# Patient Record
Sex: Female | Born: 1945 | Race: White | Hispanic: No | Smoking: Never smoker
Health system: Southern US, Community
[De-identification: ages and names within clinical notes are randomized; demographics above are authoritative.]

## PROBLEM LIST (undated history)

## (undated) DIAGNOSIS — I219 Acute myocardial infarction, unspecified: Secondary | ICD-10-CM

## (undated) DIAGNOSIS — C801 Malignant (primary) neoplasm, unspecified: Secondary | ICD-10-CM

## (undated) DIAGNOSIS — E119 Type 2 diabetes mellitus without complications: Secondary | ICD-10-CM

## (undated) DIAGNOSIS — I4891 Unspecified atrial fibrillation: Secondary | ICD-10-CM

## (undated) DIAGNOSIS — I10 Essential (primary) hypertension: Secondary | ICD-10-CM

## (undated) DIAGNOSIS — E079 Disorder of thyroid, unspecified: Secondary | ICD-10-CM

## (undated) HISTORY — PX: BACK SURGERY: SHX140

## (undated) HISTORY — PX: CORONARY ANGIOPLASTY WITH STENT PLACEMENT: SHX49

## (undated) HISTORY — PX: LIVER TRANSPLANT: SHX410

## (undated) HISTORY — PX: ABDOMINAL HYSTERECTOMY: SHX81

## (undated) HISTORY — PX: HERNIA REPAIR: SHX51

## (undated) HISTORY — PX: ABLATION: SHX5711

---

## 2001-04-23 HISTORY — PX: REDUCTION MAMMAPLASTY: SUR839

## 2004-10-10 ENCOUNTER — Encounter: Payer: Self-pay | Admitting: Internal Medicine

## 2004-10-21 ENCOUNTER — Encounter: Payer: Self-pay | Admitting: Internal Medicine

## 2005-02-08 ENCOUNTER — Ambulatory Visit: Payer: Self-pay | Admitting: Internal Medicine

## 2006-02-11 ENCOUNTER — Ambulatory Visit: Payer: Self-pay | Admitting: Internal Medicine

## 2007-07-10 ENCOUNTER — Ambulatory Visit: Payer: Self-pay | Admitting: Family Medicine

## 2008-07-13 ENCOUNTER — Ambulatory Visit: Payer: Self-pay | Admitting: Family Medicine

## 2009-08-04 ENCOUNTER — Ambulatory Visit: Payer: Self-pay | Admitting: Family Medicine

## 2010-08-29 ENCOUNTER — Ambulatory Visit: Payer: Self-pay | Admitting: Family Medicine

## 2010-12-28 DIAGNOSIS — F32A Depression, unspecified: Secondary | ICD-10-CM | POA: Insufficient documentation

## 2010-12-28 DIAGNOSIS — I2510 Atherosclerotic heart disease of native coronary artery without angina pectoris: Secondary | ICD-10-CM | POA: Insufficient documentation

## 2010-12-28 DIAGNOSIS — E781 Pure hyperglyceridemia: Secondary | ICD-10-CM | POA: Insufficient documentation

## 2010-12-28 DIAGNOSIS — E039 Hypothyroidism, unspecified: Secondary | ICD-10-CM | POA: Insufficient documentation

## 2010-12-28 DIAGNOSIS — I10 Essential (primary) hypertension: Secondary | ICD-10-CM | POA: Insufficient documentation

## 2011-09-05 ENCOUNTER — Ambulatory Visit: Payer: Self-pay | Admitting: Family Medicine

## 2012-03-05 ENCOUNTER — Ambulatory Visit: Payer: Self-pay | Admitting: Ophthalmology

## 2012-09-03 DIAGNOSIS — M48061 Spinal stenosis, lumbar region without neurogenic claudication: Secondary | ICD-10-CM | POA: Insufficient documentation

## 2012-09-05 ENCOUNTER — Ambulatory Visit: Payer: Self-pay | Admitting: Obstetrics & Gynecology

## 2013-09-08 ENCOUNTER — Ambulatory Visit: Payer: Self-pay | Admitting: Family Medicine

## 2014-09-08 ENCOUNTER — Other Ambulatory Visit: Payer: Self-pay | Admitting: Family Medicine

## 2014-09-08 DIAGNOSIS — Z1239 Encounter for other screening for malignant neoplasm of breast: Secondary | ICD-10-CM

## 2014-09-14 ENCOUNTER — Ambulatory Visit: Admission: RE | Admit: 2014-09-14 | Payer: Self-pay | Source: Ambulatory Visit

## 2014-09-15 ENCOUNTER — Ambulatory Visit
Admission: RE | Admit: 2014-09-15 | Discharge: 2014-09-15 | Disposition: A | Discharge status: Home / Self Care | Payer: Medicare Other | Source: Ambulatory Visit | Attending: Family Medicine | Admitting: Family Medicine

## 2014-09-15 DIAGNOSIS — Z1231 Encounter for screening mammogram for malignant neoplasm of breast: Secondary | ICD-10-CM | POA: Diagnosis present

## 2014-09-15 DIAGNOSIS — Z1239 Encounter for other screening for malignant neoplasm of breast: Secondary | ICD-10-CM

## 2014-09-15 HISTORY — DX: Malignant (primary) neoplasm, unspecified: C80.1

## 2015-08-29 ENCOUNTER — Other Ambulatory Visit: Payer: Self-pay | Admitting: Family Medicine

## 2015-08-29 DIAGNOSIS — Z1231 Encounter for screening mammogram for malignant neoplasm of breast: Secondary | ICD-10-CM

## 2015-09-20 ENCOUNTER — Ambulatory Visit: Admission: RE | Admit: 2015-09-20 | Payer: Medicare Other | Source: Ambulatory Visit

## 2015-09-27 ENCOUNTER — Ambulatory Visit
Admission: RE | Admit: 2015-09-27 | Discharge: 2015-09-27 | Disposition: A | Discharge status: Home / Self Care | Payer: Medicare Other | Source: Ambulatory Visit | Attending: Family Medicine | Admitting: Family Medicine

## 2015-09-27 DIAGNOSIS — Z1231 Encounter for screening mammogram for malignant neoplasm of breast: Secondary | ICD-10-CM | POA: Diagnosis present

## 2015-10-19 DIAGNOSIS — G4733 Obstructive sleep apnea (adult) (pediatric): Secondary | ICD-10-CM | POA: Insufficient documentation

## 2016-01-04 DIAGNOSIS — G8929 Other chronic pain: Secondary | ICD-10-CM | POA: Insufficient documentation

## 2016-01-04 DIAGNOSIS — M5442 Lumbago with sciatica, left side: Secondary | ICD-10-CM | POA: Insufficient documentation

## 2016-03-04 DIAGNOSIS — Z9889 Other specified postprocedural states: Secondary | ICD-10-CM | POA: Insufficient documentation

## 2016-03-04 DIAGNOSIS — Z7901 Long term (current) use of anticoagulants: Secondary | ICD-10-CM | POA: Insufficient documentation

## 2016-09-13 DIAGNOSIS — I480 Paroxysmal atrial fibrillation: Secondary | ICD-10-CM | POA: Insufficient documentation

## 2017-05-20 DIAGNOSIS — D849 Immunodeficiency, unspecified: Secondary | ICD-10-CM | POA: Insufficient documentation

## 2017-07-30 DIAGNOSIS — I252 Old myocardial infarction: Secondary | ICD-10-CM | POA: Insufficient documentation

## 2017-08-15 ENCOUNTER — Other Ambulatory Visit: Payer: Self-pay | Admitting: Family Medicine

## 2017-08-15 DIAGNOSIS — Z1231 Encounter for screening mammogram for malignant neoplasm of breast: Secondary | ICD-10-CM

## 2017-08-21 ENCOUNTER — Encounter (INDEPENDENT_AMBULATORY_CARE_PROVIDER_SITE_OTHER): Payer: Self-pay

## 2017-08-21 ENCOUNTER — Ambulatory Visit
Admission: RE | Admit: 2017-08-21 | Discharge: 2017-08-21 | Disposition: A | Discharge status: Home / Self Care | Payer: Medicare Other | Source: Ambulatory Visit | Attending: Family Medicine | Admitting: Family Medicine

## 2017-08-21 DIAGNOSIS — Z1231 Encounter for screening mammogram for malignant neoplasm of breast: Secondary | ICD-10-CM | POA: Diagnosis present

## 2018-02-06 ENCOUNTER — Other Ambulatory Visit: Payer: Self-pay | Admitting: Family Medicine

## 2018-02-06 ENCOUNTER — Ambulatory Visit
Admission: RE | Admit: 2018-02-06 | Discharge: 2018-02-06 | Disposition: A | Discharge status: Home / Self Care | Payer: Medicare Other | Source: Ambulatory Visit | Attending: Family Medicine | Admitting: Family Medicine

## 2018-02-06 DIAGNOSIS — R05 Cough: Secondary | ICD-10-CM

## 2018-02-06 DIAGNOSIS — J984 Other disorders of lung: Secondary | ICD-10-CM | POA: Insufficient documentation

## 2018-02-06 DIAGNOSIS — R052 Subacute cough: Secondary | ICD-10-CM

## 2018-05-15 ENCOUNTER — Encounter: Payer: Self-pay | Admitting: Emergency Medicine

## 2018-05-15 ENCOUNTER — Other Ambulatory Visit: Payer: Self-pay

## 2018-05-15 ENCOUNTER — Ambulatory Visit
Admission: EM | Admit: 2018-05-15 | Discharge: 2018-05-15 | Disposition: A | Discharge status: Home / Self Care | Payer: Medicare Other | Attending: Family Medicine | Admitting: Family Medicine

## 2018-05-15 DIAGNOSIS — L7622 Postprocedural hemorrhage and hematoma of skin and subcutaneous tissue following other procedure: Secondary | ICD-10-CM

## 2018-05-15 DIAGNOSIS — Z7901 Long term (current) use of anticoagulants: Secondary | ICD-10-CM | POA: Diagnosis not present

## 2018-05-15 DIAGNOSIS — T148XXA Other injury of unspecified body region, initial encounter: Secondary | ICD-10-CM | POA: Insufficient documentation

## 2018-05-15 HISTORY — DX: Disorder of thyroid, unspecified: E07.9

## 2018-05-15 HISTORY — DX: Type 2 diabetes mellitus without complications: E11.9

## 2018-05-15 HISTORY — DX: Essential (primary) hypertension: I10

## 2018-05-15 HISTORY — DX: Acute myocardial infarction, unspecified: I21.9

## 2018-05-15 MED ORDER — MUPIROCIN 2 % EX OINT: 1.0000 "application " | TOPICAL_OINTMENT | Freq: Two times a day (BID) | CUTANEOUS | 0 refills | Status: AC

## 2018-05-15 NOTE — Discharge Instructions (Addendum)
Leave dressing on until tomorrow.  Topical antibiotic as prescribed.  Take care  Dr. Lacinda Axon

## 2018-05-15 NOTE — ED Provider Notes (Signed)
[facility]    CSN: [id number] Arrival date & time: 05/15/18  1851  History   Chief Complaint Chief Complaint  Patient presents with  . Coagulation Disorder   HPI   73 year old female presents with a bleeding wound.  Patient reports that she saw her dermatologist today.  She had a wart removed on her left anterior thigh.  She states she later noted blood dripping down her leg.  Wound was oozing and the bleeding has not stopped.  Patient thus came in for evaluation.  Patient is on anticoagulation secondary to her underlying medical problems.  She is otherwise feeling well.  Mild bleeding currently.  She has tried to compress the area and use ice but has had no improvement.  No other reported symptoms.  No other complaints.  History reviewed as below. PMH: Depression    Hypothyroidism    Coronary artery disease    Hypertension    Cirrhosis (CMS-HCC)    Pericardial effusion    Paroxysmal atrial flutter (CMS-HCC)    Non-alcoholic fatty liver disease    Liver transplanted (CMS-HCC)    Atrial fibrillation (CMS-HCC)    HLD (hyperlipidemia)    Sleep apnea  cpap  Obesity (BMI 30-39.9)    Spinal stenosis of lumbar region    H/O: GI bleed    Encounter for blood transfusion    Cancer (CMS-HCC)  skin   Chronic kidney disease    Fasting hyperglycemia 02/09/2016   Arthritis    PAF (paroxysmal atrial fibrillation) (CMS-HCC) 07/13/2015   Anticoagulated 03/04/2016   Status post catheter ablation of atrial fibrillation 03/04/2016   History of amiodarone therapy 03/04/2016   CKD (chronic kidney disease) stage 3, GFR 30-59 ml/min (CMS-HCC) 08/13/2016   Diabetes mellitus type 2, uncomplicated (CMS-HCC)    Need for prophylactic immunotherapy 07/09/2012   Myocardial infarction (CMS-HCC)     Surgical Hx: HYSTERECTOMY   vaginal   APPENDECTOMY      ENDOSCOPIC CARPAL TUNNEL RELEASE  Right    HEMMORRHOIDECTOMY      LASIX EYE  SURGERY  Bilateral    CORONARY ANGIOPLASTY WITH STENT PLACEMENT 04/24/1999 - 04/22/2000  1 stent LAD   REDUCTION MAMMAPLASTY      LIVER TRANSPLANTATION 04/24/2007 - 04/22/2008     PERICARDIAL WINDOW      Lumbar Steriod Epidural Injection 09/12/2012     SKIN BIOPSY      COLONOSCOPY W/REMOVAL LESIONS BY SNARE 10/13/2013  Procedure: COLONOSCOPY, FLEXIBLE; WITH REMOVAL OF TUMOR(S), POLYP(S), OR OTHER LESION(S) BY SNARE TECHNIQUE; Surgeon: Lizabeth Leyden, MD; Location: [facility]; Service: Gastroenterology;;   Lumbar ESI 04/24/2011 - 04/22/2012     ABLATION ARRYTHMIA FOCUS      ABDOMINAL SURGERY      EYE SURGERY 1999 & 2015  lasix/ cataract   CHOLECYSTECTOMY 04/24/2007 - 04/22/2008  at time of transplant   [place] 09/03/2016 Chest/Left Procedure: Left THORACOSCOPIC CREATION OF PERICARDIAL WINDOW OR PARTIAL RESECTION OF PERICARDIAL [place]; Surgeon: Guadalupe Dawn, MD; Location: [facility]; Service: Cardiothoracic; Laterality: Left;   COLONOSCOPY W/BIOPSY 06/27/2017 N/A Procedure: Colonoscopy; Surgeon: Erenest Rasher, MD; Location: [facility]; Service: Gastroenterology; Laterality: N/A;   COLONOSCOPY W/REMOVAL LESIONS BY SNARE 06/27/2017 N/A Procedure: COLONOSCOPY, FLEXIBLE; WITH REMOVAL OF TUMOR(S), POLYP(S), OR OTHER LESION(S) BY SNARE TECHNIQUE; Surgeon: Erenest Rasher, MD; Location: [facility]; Service: Gastroenterology; Laterality: N/A;      OB History   No obstetric history on file.    Home Medications  Prior to Admission medications   Medication Sig Start Date End Date Taking? Authorizing Provider  atorvastatin (LIPITOR) 80 MG tablet Take by mouth. 08/19/17  Yes [provider]  Blood Glucose Monitoring Suppl ([place]) [place] 1 each by XX route as directed 02/18/18  Yes [provider]  candesartan (ATACAND) 16 MG tablet Take  by mouth. 10/22/17 10/22/18 Yes [provider]  clopidogrel (PLAVIX) 75 MG tablet Take by mouth. 08/01/17  Yes [provider]  dabigatran (PRADAXA) 150 MG CAPS capsule Take by mouth. 04/07/18  Yes [provider]  glipiZIDE (GLUCOTROL XL) 2.5 MG 24 hr tablet Take by mouth. 01/14/18 01/14/19 Yes [provider]  ipratropium (ATROVENT) 0.03 % nasal spray Place into the nose. 12/26/17 12/26/18 Yes [provider]  levothyroxine (SYNTHROID, LEVOTHROID) 137 MCG tablet Take by mouth. 04/24/18  Yes [provider]  magnesium oxide (MAG-OX) 400 MG tablet Take by mouth. 09/14/09  Yes [provider]  metoprolol succinate (TOPROL-XL) 100 MG 24 hr tablet Take by mouth. 03/29/17  Yes [provider]  nitroGLYCERIN (NITROSTAT) 0.4 MG SL tablet Place under the tongue. 07/31/17  Yes [provider]  pantoprazole (PROTONIX) 40 MG tablet Take by mouth. 02/12/18  Yes [provider]  tacrolimus (PROGRAF) 0.5 MG capsule Take 0.5 mg (1 capsule) BID, alternating days with 0.5mg  (1 capsule) am and 1.0 mg (2 capsules) the next day - PO 09/26/17  Yes [provider]  venlafaxine XR (EFFEXOR-XR) 75 MG 24 hr capsule Take by mouth. 04/07/18  Yes [provider]  Artificial Tear Ointment (ULTRA FRESH PM) OINT Apply to eye.    [provider]  B Complex Vitamins (VITAMIN B-COMPLEX) TABS Take by mouth.    [provider]  Biotin 1000 MCG tablet Take by mouth.    [provider]  Calcium-Magnesium-Vitamin D (CITRACAL CALCIUM+D) [phone] MG-MG-UNIT TB24 Take by mouth.    [provider]  Cholecalciferol (VITAMIN D3) 25 MCG (1000 UT) CAPS Take by mouth.    [provider]  hydrocortisone 2.5 % lotion Apply topically.    [provider]  mupirocin ointment (BACTROBAN) 2 % Apply 1 application topically 2 (two) times daily for 7 days. 05/15/18 05/22/18  Coral Spikes, DO   Social  History Social History   Tobacco Use  . Smoking status: Never Smoker  . Smokeless tobacco: Never Used  Substance Use Topics  . Alcohol use: Not Currently  . Drug use: Never    Allergies   Iodinated diagnostic agents and Lisinopril  Review of Systems Review of Systems  Constitutional: Negative.   Skin: Positive for wound.   Physical Exam Triage Vital Signs ED Triage Vitals  Enc Vitals Group     BP 05/15/18 1942 (!) 163/89     Pulse Rate 05/15/18 1942 62     Resp 05/15/18 1942 18     Temp 05/15/18 1942 98.6 F (37 C)     Temp Source 05/15/18 1942 Oral     SpO2 05/15/18 1942 97 %     Weight 05/15/18 1937 206 lb (93.4 kg)     Height 05/15/18 1937 5\' 5"  (1.651 m)     Head Circumference --      Peak Flow --      Pain Score 05/15/18 1937 0     Pain Loc --      Pain Edu? --      Excl. in [place]? --    Updated Vital Signs  BP (!) 163/89 (BP Location: Right Arm)   Pulse 62   Temp 98.6 F (37 C) (Oral)   Resp 18   Ht 5\' 5"  (1.651 m)   Wt 93.4 kg   SpO2 97%   BMI 34.28 kg/m   Visual Acuity Right Eye Distance:   Left Eye Distance:   Bilateral Distance:    Right Eye Near:   Left Eye Near:    Bilateral Near:     Physical Exam Vitals signs and nursing note reviewed.  Constitutional:      General: She is not in acute distress. HENT:     Head: Normocephalic and atraumatic.     Nose: Nose normal.  Eyes:     General: No scleral icterus.    Conjunctiva/sclera: Conjunctivae normal.  Pulmonary:     Effort: Pulmonary effort is normal.     Breath sounds: Normal breath sounds.  Skin:    Comments: Left anterior thigh with a small wound which is currently bleeding.  Patient appears to have had a shave removal of skin lesion.  Neurological:     Mental Status: She is alert.  Psychiatric:        Mood and Affect: Mood normal.        Behavior: Behavior normal.    UC Treatments / Results  Labs (all labs ordered are listed, but only abnormal results are displayed) Labs  Reviewed - No data to display  EKG None  Radiology No results found.  Procedures Procedures (including critical care time) Area was cleaned with alcohol.  Area was anesthetized with 1% lidocaine with epinephrine.  Wound was cauterized and bleeding resolved.  Wound was dressed with antibiotic ointment, nonstick dressing, and gauze as well as [place].  Patient tolerated procedure well without difficulty.  Medications Ordered in UC Medications - No data to display  Initial Impression / Assessment and Plan / UC Course  I have reviewed the triage vital signs and the nursing notes.  Pertinent labs & imaging results that were available during my care of the patient were reviewed by me and considered in my medical decision making (see chart for details).    73 year old female presents with a bleeding wound after a dermatologic procedure today.  Wound was cauterized today.  See above.  Patient will keep on the dressing until tomorrow.  She is then to change his dressing daily.  Advised use of Bactroban as prescribed.  Supportive care.  Final Clinical Impressions(s) / UC Diagnoses   Final diagnoses:  Bleeding from wound     Discharge Instructions     Leave dressing on until tomorrow.  Topical antibiotic as prescribed.  Take care  Dr. Lacinda Axon    ED Prescriptions    Medication Sig Dispense Auth. Provider   mupirocin ointment (BACTROBAN) 2 % Apply 1 application topically 2 (two) times daily for 7 days. 22 g Coral Spikes, DO     Controlled Substance Prescriptions  Controlled Substance Registry consulted? Not Applicable   Coral Spikes, DO 05/15/18 2128

## 2018-05-15 NOTE — ED Triage Notes (Signed)
Patient states she saw her dermatologist today and had a wart frozen off on her upper left leg. She states the area continues to have mild bleeding. She is on a blood thinner.

## 2018-09-22 ENCOUNTER — Other Ambulatory Visit: Payer: Self-pay | Admitting: Family Medicine

## 2018-09-22 DIAGNOSIS — Z1231 Encounter for screening mammogram for malignant neoplasm of breast: Secondary | ICD-10-CM

## 2018-11-03 ENCOUNTER — Other Ambulatory Visit: Payer: Self-pay

## 2018-11-03 ENCOUNTER — Ambulatory Visit
Admission: RE | Admit: 2018-11-03 | Discharge: 2018-11-03 | Disposition: A | Discharge status: Home / Self Care | Payer: Medicare Other | Source: Ambulatory Visit | Attending: Family Medicine | Admitting: Family Medicine

## 2018-11-03 ENCOUNTER — Encounter (INDEPENDENT_AMBULATORY_CARE_PROVIDER_SITE_OTHER): Payer: Self-pay

## 2018-11-03 DIAGNOSIS — Z1231 Encounter for screening mammogram for malignant neoplasm of breast: Secondary | ICD-10-CM

## 2018-11-30 ENCOUNTER — Emergency Department: Payer: Medicare Other

## 2018-11-30 ENCOUNTER — Ambulatory Visit
Admission: EM | Admit: 2018-11-30 | Discharge: 2018-11-30 | Discharge status: Against Medical Advice | Payer: Medicare Other | Source: Home / Self Care

## 2018-11-30 ENCOUNTER — Encounter: Payer: Self-pay | Admitting: Intensive Care

## 2018-11-30 ENCOUNTER — Other Ambulatory Visit: Payer: Self-pay

## 2018-11-30 ENCOUNTER — Emergency Department
Admission: EM | Admit: 2018-11-30 | Discharge: 2018-11-30 | Disposition: A | Discharge status: Home / Self Care | Payer: Medicare Other | Attending: Emergency Medicine | Admitting: Emergency Medicine

## 2018-11-30 DIAGNOSIS — Z955 Presence of coronary angioplasty implant and graft: Secondary | ICD-10-CM | POA: Insufficient documentation

## 2018-11-30 DIAGNOSIS — Z85828 Personal history of other malignant neoplasm of skin: Secondary | ICD-10-CM | POA: Insufficient documentation

## 2018-11-30 DIAGNOSIS — I252 Old myocardial infarction: Secondary | ICD-10-CM | POA: Insufficient documentation

## 2018-11-30 DIAGNOSIS — I10 Essential (primary) hypertension: Secondary | ICD-10-CM | POA: Insufficient documentation

## 2018-11-30 DIAGNOSIS — K5900 Constipation, unspecified: Secondary | ICD-10-CM | POA: Insufficient documentation

## 2018-11-30 DIAGNOSIS — Z944 Liver transplant status: Secondary | ICD-10-CM | POA: Insufficient documentation

## 2018-11-30 DIAGNOSIS — E119 Type 2 diabetes mellitus without complications: Secondary | ICD-10-CM | POA: Insufficient documentation

## 2018-11-30 HISTORY — DX: Unspecified atrial fibrillation: I48.91

## 2018-11-30 MED ORDER — POLYETHYLENE GLYCOL 3350 17 GM/SCOOP PO POWD: 17.0000 g | Freq: Every day | ORAL | 0 refills | Status: AC | PRN

## 2018-11-30 NOTE — ED Notes (Signed)
Sent rainbow to lab. 

## 2018-11-30 NOTE — ED Notes (Signed)
Pt and son pending discharge. EDP still needs to talk to the pt. Pt and son are informed about the delay

## 2018-11-30 NOTE — ED Notes (Signed)
Pt cleaned of stool and wheeled out to car to leave with son.

## 2018-11-30 NOTE — ED Notes (Signed)
Pt went to restroom and had a very large bowel movement. The pt stated after having a bowel movement that she felt better and felt she passed what was blocked. We changed and cleaned pt up,and is waiting in sub wait area.

## 2018-11-30 NOTE — Discharge Instructions (Signed)
Please continue to use Colace twice daily while taking oxycodone, you may also use MiraLAX once daily while taking oxycodone.  Once you have stopped taking oxycodone please continue to take Colace once daily unless you develop diarrhea you may then back off.  Please follow-up with your doctor.

## 2018-11-30 NOTE — ED Notes (Signed)
First Nurse Note: Pt to ED via POV, pt dislocated both shoulders last week, seen at [facility], has been taking pain medication since then. Reports that she has not had a BM in 6 days.

## 2018-11-30 NOTE — ED Notes (Signed)
Label printer down. Called lab-spoke to [place] about putting chart labels on.

## 2018-11-30 NOTE — ED Provider Notes (Signed)
[facility] Emergency Department Provider Note  Time seen: 4:10 PM  I have reviewed the triage vital signs and the nursing notes.   HISTORY  Chief Complaint Fecal Impaction   HPI Sara Larson is a 73 y.o. female with a past medical history of atrial fibrillation, diabetes, hypertension, presents to the emergency department for constipation.  According to the patient she had a fall approximately 1 week ago injuring her shoulder, was placed on oxycodone.  States since that time she is not had a bowel movement.  Yesterday had a very watery bowel movement but felt impacted.  She came to the ER today due to discomfort however while waiting to be seen the patient had a very large bowel movement per patient.  States she is feeling much better.  Patient has been taking Colace at home with the oxycodone.  Denies any recent fever cough congestion or shortness of breath.   Past Medical History:  Diagnosis Date  . Atrial fibrillation ([place])   . Cancer ([place])    skin ca  . Cancer ([place])    liver ca  . Diabetes mellitus without complication ([place])   . Hypertension   . Myocardial infarct ([place])   . Thyroid disease     There are no active problems to display for this patient.   Past Surgical History:  Procedure Laterality Date  . ABDOMINAL HYSTERECTOMY    . ABLATION    . CORONARY ANGIOPLASTY WITH STENT PLACEMENT    . LIVER TRANSPLANT    . REDUCTION MAMMAPLASTY  2003    Prior to Admission medications   Medication Sig Start Date End Date Taking? Authorizing Provider  Artificial Tear Ointment (ULTRA FRESH PM) OINT Apply to eye.    [provider]  atorvastatin (LIPITOR) 80 MG tablet Take by mouth. 08/19/17   [provider]  B Complex Vitamins (VITAMIN B-COMPLEX) TABS Take by mouth.    [provider]  Biotin 1000 MCG tablet Take by mouth.    [provider]  Blood Glucose Monitoring Suppl ([place]) [place] 1 each by  XX route as directed 02/18/18   [provider]  Calcium-Magnesium-Vitamin D (CITRACAL CALCIUM+D) [phone] MG-MG-UNIT TB24 Take by mouth.    [provider]  candesartan (ATACAND) 16 MG tablet Take by mouth. 10/22/17 10/22/18  [provider]  Cholecalciferol (VITAMIN D3) 25 MCG (1000 UT) CAPS Take by mouth.    [provider]  clopidogrel (PLAVIX) 75 MG tablet Take by mouth. 08/01/17   [provider]  dabigatran (PRADAXA) 150 MG CAPS capsule Take by mouth. 04/07/18   [provider]  glipiZIDE (GLUCOTROL XL) 2.5 MG 24 hr tablet Take by mouth. 01/14/18 01/14/19  [provider]  hydrocortisone 2.5 % lotion Apply topically.    [provider]  ipratropium (ATROVENT) 0.03 % nasal spray Place into the nose. 12/26/17 12/26/18  [provider]  levothyroxine (SYNTHROID, LEVOTHROID) 137 MCG tablet Take by mouth. 04/24/18   [provider]  magnesium oxide (MAG-OX) 400 MG tablet Take by mouth. 09/14/09   [provider]  metoprolol succinate (TOPROL-XL) 100 MG 24 hr tablet Take by mouth. 03/29/17   [provider]  nitroGLYCERIN (NITROSTAT) 0.4 MG SL tablet Place under the tongue. 07/31/17   [provider]  pantoprazole (PROTONIX) 40 MG tablet Take by mouth. 02/12/18   [provider]  tacrolimus (PROGRAF) 0.5 MG capsule Take 0.5 mg (1 capsule) BID, alternating days with 0.5mg  (1  capsule) am and 1.0 mg (2 capsules) the next day - PO 09/26/17   [provider]  venlafaxine XR (EFFEXOR-XR) 75 MG 24 hr capsule Take by mouth. 04/07/18   [provider]    Allergies  Allergen Reactions  . Iodinated Diagnostic Agents Other (See Comments)    Rash/red face  . Lisinopril Cough    Family History  Problem Relation Age of Onset  . Breast cancer Paternal Grandmother 23    Social History Social History   Tobacco Use  . Smoking status: Never Smoker  . Smokeless tobacco: Never  Used  Substance Use Topics  . Alcohol use: Not Currently  . Drug use: Never    Review of Systems Constitutional: Negative for fever. ENT: Negative for recent illness/congestion Cardiovascular: Negative for chest pain. Respiratory: Negative for shortness of breath. Gastrointestinal: Constipation, now resolved. Musculoskeletal: Negative for musculoskeletal complaints Skin: Negative for skin complaints  Neurological: Negative for headache All other ROS negative  ____________________________________________   PHYSICAL EXAM:  VITAL SIGNS: ED Triage Vitals  Enc Vitals Group     BP 11/30/18 1413 (!) 141/72     Pulse Rate 11/30/18 1413 67     Resp 11/30/18 1550 18     Temp 11/30/18 1413 99.1 F (37.3 C)     Temp Source 11/30/18 1413 Oral     SpO2 11/30/18 1413 98 %     Weight 11/30/18 1414 210 lb (95.3 kg)     Height 11/30/18 1414 5\' 5"  (1.651 m)     Head Circumference --      Peak Flow --      Pain Score 11/30/18 1430 10     Pain Loc --      Pain Edu? --      Excl. in [place]? --    Constitutional: Alert and oriented. Well appearing and in no distress. Eyes: Normal exam ENT      Head: Normocephalic and atraumatic.      Mouth/Throat: Mucous membranes are moist. Cardiovascular: Normal rate, regular rhythm. Respiratory: Normal respiratory effort without tachypnea nor retractions. Breath sounds are clear  Gastrointestinal: Soft and nontender. No distention.   Musculoskeletal: Bilateral shoulder pain Neurologic:  Normal speech and language. No gross focal neurologic deficits  Skin:  Skin is warm, dry and intact.  Psychiatric: Mood and affect are normal.  ___________________________________________    RADIOLOGY  IMPRESSION:  1. Nonobstructive bowel gas pattern.  2. Moderate amount of formed stool in the right colon.   ____________________________________________   INITIAL IMPRESSION / ASSESSMENT AND PLAN / ED COURSE  Pertinent labs & imaging results that were  available during my care of the patient were reviewed by me and considered in my medical decision making (see chart for details).   Patient presents to the emergency department for constipation.  Differential would include constipation due to opioid use, fecal impaction, intra-abdominal pathology.  Patient had a large bowel movement in the emergency department now feels considerably better.  We will obtain a 2 view x-ray to evaluate for stool burden.  We will likely discharge home with MiraLAX and continued Colace.  Patient agreeable to plan of care.  Patient's x-ray shows a nonobstructive bowel gas pattern with moderate mount of formed stool in the right colon.  We will discharge with MiraLAX in addition of the patient's Colace.  Patient will follow-up with her doctor.  Sara Larson was evaluated in Emergency Department on 11/30/2018 for the symptoms described in the history of present illness. She  was evaluated in the context of the global COVID-19 pandemic, which necessitated consideration that the patient might be at risk for infection with the SARS-CoV-2 virus that causes COVID-19. Institutional protocols and algorithms that pertain to the evaluation of patients at risk for COVID-19 are in a state of rapid change based on information released by regulatory bodies including the CDC and federal and state organizations. These policies and algorithms were followed during the patient's care in the ED.  ____________________________________________   FINAL CLINICAL IMPRESSION(S) / ED DIAGNOSES  Constipation   Harvest Dark, MD 11/30/18 1718

## 2018-11-30 NOTE — ED Notes (Signed)
Checked pt's vitals after bowel movement and reported to [employer]. Pt in laying back in sub wait and pt states she is feeling good. Gave pt call bell and blanket.

## 2018-11-30 NOTE — ED Triage Notes (Signed)
Patient presents with constipation since 11/23/18. Patient has been taking oxy for fall.

## 2018-11-30 NOTE — ED Notes (Signed)
Pt given water, ok by MD

## 2018-11-30 NOTE — ED Notes (Signed)
Pt c/o constipation and she states that she hasn't had a bowel movement since 11/23/2018. While waiting for a room pt had a very large BM and states she better but "tired". On assessment, abd is soft and not distended. No NVD.

## 2018-11-30 NOTE — ED Notes (Addendum)
Pt had a small BM while she was being transported from imaging. Brief changed and pt cleaned

## 2018-12-22 DIAGNOSIS — S43429A Sprain of unspecified rotator cuff capsule, initial encounter: Secondary | ICD-10-CM | POA: Insufficient documentation

## 2018-12-22 DIAGNOSIS — M75101 Unspecified rotator cuff tear or rupture of right shoulder, not specified as traumatic: Secondary | ICD-10-CM | POA: Insufficient documentation

## 2018-12-22 DIAGNOSIS — M12811 Other specific arthropathies, not elsewhere classified, right shoulder: Secondary | ICD-10-CM | POA: Insufficient documentation

## 2018-12-31 DIAGNOSIS — E785 Hyperlipidemia, unspecified: Secondary | ICD-10-CM | POA: Insufficient documentation

## 2018-12-31 DIAGNOSIS — G4700 Insomnia, unspecified: Secondary | ICD-10-CM | POA: Insufficient documentation

## 2018-12-31 DIAGNOSIS — D649 Anemia, unspecified: Secondary | ICD-10-CM | POA: Insufficient documentation

## 2018-12-31 DIAGNOSIS — R001 Bradycardia, unspecified: Secondary | ICD-10-CM | POA: Insufficient documentation

## 2018-12-31 DIAGNOSIS — N183 Chronic kidney disease, stage 3 unspecified: Secondary | ICD-10-CM | POA: Insufficient documentation

## 2018-12-31 DIAGNOSIS — N184 Chronic kidney disease, stage 4 (severe): Secondary | ICD-10-CM | POA: Insufficient documentation

## 2019-01-26 DIAGNOSIS — M19019 Primary osteoarthritis, unspecified shoulder: Secondary | ICD-10-CM | POA: Insufficient documentation

## 2019-01-26 DIAGNOSIS — Z96611 Presence of right artificial shoulder joint: Secondary | ICD-10-CM | POA: Insufficient documentation

## 2019-03-31 DIAGNOSIS — M25552 Pain in left hip: Secondary | ICD-10-CM | POA: Insufficient documentation

## 2019-06-09 DIAGNOSIS — M7062 Trochanteric bursitis, left hip: Secondary | ICD-10-CM | POA: Insufficient documentation

## 2019-06-09 DIAGNOSIS — Z944 Liver transplant status: Secondary | ICD-10-CM | POA: Insufficient documentation

## 2019-06-24 DIAGNOSIS — K429 Umbilical hernia without obstruction or gangrene: Secondary | ICD-10-CM | POA: Insufficient documentation

## 2019-07-02 DIAGNOSIS — M76892 Other specified enthesopathies of left lower limb, excluding foot: Secondary | ICD-10-CM | POA: Insufficient documentation

## 2019-07-15 ENCOUNTER — Encounter: Payer: Self-pay | Admitting: Emergency Medicine

## 2019-07-15 ENCOUNTER — Emergency Department: Payer: Medicare Other

## 2019-07-15 ENCOUNTER — Emergency Department
Admission: EM | Admit: 2019-07-15 | Discharge: 2019-07-15 | Disposition: A | Discharge status: Home / Self Care | Payer: Medicare Other | Attending: Emergency Medicine | Admitting: Emergency Medicine

## 2019-07-15 ENCOUNTER — Other Ambulatory Visit: Payer: Self-pay

## 2019-07-15 DIAGNOSIS — E119 Type 2 diabetes mellitus without complications: Secondary | ICD-10-CM | POA: Insufficient documentation

## 2019-07-15 DIAGNOSIS — Z79899 Other long term (current) drug therapy: Secondary | ICD-10-CM | POA: Diagnosis not present

## 2019-07-15 DIAGNOSIS — Z955 Presence of coronary angioplasty implant and graft: Secondary | ICD-10-CM | POA: Diagnosis not present

## 2019-07-15 DIAGNOSIS — Z7984 Long term (current) use of oral hypoglycemic drugs: Secondary | ICD-10-CM | POA: Insufficient documentation

## 2019-07-15 DIAGNOSIS — K5900 Constipation, unspecified: Secondary | ICD-10-CM | POA: Insufficient documentation

## 2019-07-15 DIAGNOSIS — Z7901 Long term (current) use of anticoagulants: Secondary | ICD-10-CM | POA: Insufficient documentation

## 2019-07-15 DIAGNOSIS — Z944 Liver transplant status: Secondary | ICD-10-CM | POA: Insufficient documentation

## 2019-07-15 MED ORDER — MAGNESIUM CITRATE PO SOLN
1.0000 | Freq: Once | ORAL | Status: AC
  Administered 2019-07-15: 1 via ORAL
  Filled 2019-07-15: qty 296

## 2019-07-15 MED ORDER — SORBITOL 70 % SOLN
960.0000 mL | TOPICAL_OIL | Freq: Once | ORAL | Status: AC
  Administered 2019-07-15: 960 mL via RECTAL
  Filled 2019-07-15: qty 240

## 2019-07-15 MED ORDER — DOCUSATE SODIUM 50 MG/5ML PO LIQD
100.0000 mg | Freq: Once | ORAL | Status: AC
  Administered 2019-07-15: 08:00:00 100 mg via ORAL
  Filled 2019-07-15: qty 10

## 2019-07-15 MED ORDER — MINERAL OIL RE ENEM
1.0000 | ENEMA | Freq: Once | RECTAL | Status: AC
  Administered 2019-07-15: 1 via RECTAL

## 2019-07-15 NOTE — ED Notes (Addendum)
First enema unsuccessful at this point. Second enema (soap suds) administered.

## 2019-07-15 NOTE — ED Notes (Signed)
FIRST NURSE NOTE:  PT here with son with complaints of constipation.

## 2019-07-15 NOTE — ED Triage Notes (Signed)
Patient ambulatory to triage with steady gait, without difficulty or distress noted; pt reports no BM since Saturday; denies any pain or nausea; miralax & dulcolax without relief today

## 2019-07-15 NOTE — ED Notes (Signed)
Patient had minimal BM with second enema (soap suds enema). Fleets enema with mineral oil administered.

## 2019-07-15 NOTE — ED Provider Notes (Signed)
[facility] Emergency Department Provider Note  ____________________________________________   First MD Initiated Contact with Patient 07/15/19 [phone]     (approximate)  I have reviewed the triage vital signs and the nursing notes.   HISTORY  Chief Complaint Constipation   HPI Sara Larson is a 74 y.o. female presents to the ED with complaint of constipation.  Patient states that on last Monday she had hernia surgery and was discharged on pain medication.  Patient states that she has not taken any of the pain medication but has not been able to have a BM since Saturday.  Patient states that yesterday she took 2 Dulcolax and 1 capful of MiraLAX without any relief.         Past Medical History:  Diagnosis Date  . Atrial fibrillation ([place])   . Cancer ([place])    skin ca  . Cancer ([place])    liver ca  . Diabetes mellitus without complication ([place])   . Hypertension   . Myocardial infarct ([place])   . Thyroid disease     There are no problems to display for this patient.   Past Surgical History:  Procedure Laterality Date  . ABDOMINAL HYSTERECTOMY    . ABLATION    . CORONARY ANGIOPLASTY WITH STENT PLACEMENT    . HERNIA REPAIR    . LIVER TRANSPLANT    . REDUCTION MAMMAPLASTY  2003    Prior to Admission medications   Medication Sig Start Date End Date Taking? Authorizing Provider  Artificial Tear Ointment (ULTRA FRESH PM) OINT Apply to eye.    [provider]  atorvastatin (LIPITOR) 80 MG tablet Take by mouth. 08/19/17   [provider]  B Complex Vitamins (VITAMIN B-COMPLEX) TABS Take by mouth.    [provider]  Biotin 1000 MCG tablet Take by mouth.    [provider]  Blood Glucose Monitoring Suppl ([place]) [place] 1 each by XX route as directed 02/18/18   [provider]  Calcium-Magnesium-Vitamin D (CITRACAL CALCIUM+D) [id number] MG-MG-UNIT TB24 Take by mouth.    [provider]  candesartan (ATACAND) 16 MG tablet Take by mouth. 10/22/17 10/22/18  [provider]  Cholecalciferol (VITAMIN D3) 25 MCG (1000 UT) CAPS Take by mouth.    [provider]  clopidogrel (PLAVIX) 75 MG tablet Take by mouth. 08/01/17   [provider]  dabigatran (PRADAXA) 150 MG CAPS capsule Take by mouth. 04/07/18   [provider]  glipiZIDE (GLUCOTROL XL) 2.5 MG 24 hr tablet Take by mouth. 01/14/18 01/14/19  [provider]  hydrocortisone 2.5 % lotion Apply topically.    [provider]  ipratropium (ATROVENT) 0.03 % nasal spray Place into the nose. 12/26/17 12/26/18  [provider]  levothyroxine (SYNTHROID, LEVOTHROID) 137 MCG tablet Take by mouth. 04/24/18   [provider]  magnesium oxide (MAG-OX) 400 MG tablet Take by mouth. 09/14/09   [provider]  metoprolol succinate (TOPROL-XL) 100 MG 24 hr tablet Take by mouth. 03/29/17   [provider]  nitroGLYCERIN (NITROSTAT) 0.4 MG SL tablet Place under the tongue. 07/31/17   [provider]  pantoprazole (PROTONIX) 40 MG tablet Take by mouth. 02/12/18   [provider]  polyethylene glycol powder (GLYCOLAX/MIRALAX) 17 GM/SCOOP powder Take 17 g by mouth daily as needed for moderate constipation. 11/30/18   Harvest Dark, MD  tacrolimus (PROGRAF) 0.5 MG capsule Take 0.5 mg (1 capsule) BID, alternating days with 0.5mg  (1 capsule)  am and 1.0 mg (2 capsules) the next day - PO 09/26/17   [provider]  venlafaxine XR (EFFEXOR-XR) 75 MG 24 hr capsule Take by mouth. 04/07/18   [provider]    Allergies Iodinated diagnostic agents and Lisinopril  Family History  Problem Relation Age of Onset  . Breast cancer Paternal Grandmother 53    Social History Social History   Tobacco Use  . Smoking status: Never Smoker  . Smokeless tobacco: Never Used  Substance Use Topics  . Alcohol use: Not Currently  . Drug use:  Never    Review of Systems Constitutional: No fever/chills Eyes: No visual changes. ENT: No sore throat. Cardiovascular: Denies chest pain. Respiratory: Denies shortness of breath.  Negative for cough. Gastrointestinal: No abdominal pain.  No nausea, no vomiting.  No diarrhea.  Positive constipation. Genitourinary: Negative for dysuria. Musculoskeletal: Negative for muscle aches. Skin: Negative for rash. Neurological: Negative for headaches, focal weakness or numbness. ___________________________________________   PHYSICAL EXAM:  VITAL SIGNS: ED Triage Vitals  Enc Vitals Group     BP 07/15/19 0036 133/83     Pulse Rate 07/15/19 0036 (!) 51     Resp 07/15/19 0036 20     Temp 07/15/19 0036 98.7 F (37.1 C)     Temp Source 07/15/19 0036 Oral     SpO2 07/15/19 0036 95 %     Weight 07/15/19 0036 220 lb (99.8 kg)     Height 07/15/19 0036 5\' 5"  (1.651 m)     Head Circumference --      Peak Flow --      Pain Score 07/15/19 0043 0     Pain Loc --      Pain Edu? --      Excl. in [place]? --    Constitutional: Alert and oriented. Well appearing and in no acute distress. Eyes: Conjunctivae are normal.  Head: Atraumatic. Neck: No stridor.   Cardiovascular: Normal rate, regular rhythm. Grossly normal heart sounds.  Good peripheral circulation. Respiratory: Normal respiratory effort.  No retractions. Lungs CTAB. Gastrointestinal: Soft and nontender. No distention.  Bowel sounds are present x4 quadrants. Musculoskeletal: No lower extremity tenderness nor edema.  No joint effusions. Neurologic:  Normal speech and language. No gross focal neurologic deficits are appreciated. No gait instability. Skin:  Skin is warm, dry and intact. No rash noted. Psychiatric: Mood and affect are normal. Speech and behavior are normal.  ____________________________________________   LABS (all labs ordered are listed, but only abnormal results are displayed)  Labs Reviewed - No data to  display  RADIOLOGY   Official radiology report(s): DG Abdomen 1 View  Result Date: 07/15/2019 CLINICAL DATA:  Constipation EXAM: ABDOMEN - 1 VIEW COMPARISON:  None. FINDINGS: There is a moderate to large amount of colonic stool. There is air down to the level of the rectum. No definite evidence of obstruction. No radio-opaque calculi or other significant radiographic abnormality are seen. IMPRESSION: Moderate to large amount of colonic stool. No definite evidence of obstruction. Electronically Signed   By: Prudencio Pair M.D.   On: 07/15/2019 01:17    ____________________________________________   PROCEDURES  Procedure(s) performed (including Critical Care):  Procedures  ___________________________________________   INITIAL IMPRESSION / ASSESSMENT AND PLAN / ED COURSE  As part of my medical decision making, I reviewed the following data within the electronic MEDICAL RECORD NUMBER Notes from prior ED visits and [place] Controlled Substance Database    ----------------------------------------- 1:33 PM on 07/15/2019 ----------------------------------------- Patient is feeling  much better and has had several bowel movements especially after the smog enema.  She denies any abdominal pain.   74 year old female presents to the ED with complaint of constipation for the last 4 days.  Patient states that she had surgery and although she was given pain medication to take at home she has not taken it.  Yesterday she took 2 Dulcolax tablets and 1 capful of MiraLAX without any relief.  X-ray shows moderate stool.  Patient was given enemas plus half a bottle containing Colace 100 mg suspension and mag citrate.  Prior to discharge patient had multiple bowel movements and states that she is feeling much better.  She was instructed to continue with Colace or MiraLAX which she already has at home.  She is instructed if she uses the MiraLAX to make sure she drinks plenty of water with it as it could cause more  constipation.  She is to follow-up with her PCP if any continued problems.  Most likely her constipation was caused from the anesthesia that was used during her surgery.  ____________________________________________   FINAL CLINICAL IMPRESSION(S) / ED DIAGNOSES  Final diagnoses:  Constipation, unspecified constipation type     ED Discharge Orders    None       Note:  This document was prepared using Dragon voice recognition software and may include unintentional dictation errors.    Johnn Hai, PA-C 07/15/19 1351    Earleen Newport, MD 07/15/19 1525

## 2019-07-15 NOTE — Discharge Instructions (Signed)
Follow-up with your primary care provider if any continued problems or concerns.  Read the information about high fiber diets.  Also a you may continue taking Colace stool softeners to prevent getting constipated.  The MiraLAX that she obtained you can continue taking 1/2-1 Full per day with any beverage of your choice once a day.  Follow this with lots of fluids including 1-2 bottles of water with the MiraLAX.

## 2019-09-24 ENCOUNTER — Other Ambulatory Visit: Payer: Self-pay | Admitting: Family Medicine

## 2019-09-24 DIAGNOSIS — Z1231 Encounter for screening mammogram for malignant neoplasm of breast: Secondary | ICD-10-CM

## 2019-10-08 ENCOUNTER — Other Ambulatory Visit: Payer: Self-pay

## 2019-10-08 ENCOUNTER — Emergency Department
Admission: EM | Admit: 2019-10-08 | Discharge: 2019-10-08 | Disposition: A | Discharge status: Home / Self Care | Payer: Medicare Other | Attending: Emergency Medicine | Admitting: Emergency Medicine

## 2019-10-08 ENCOUNTER — Encounter: Payer: Self-pay | Admitting: Emergency Medicine

## 2019-10-08 ENCOUNTER — Emergency Department: Payer: Medicare Other

## 2019-10-08 DIAGNOSIS — Z7901 Long term (current) use of anticoagulants: Secondary | ICD-10-CM | POA: Diagnosis not present

## 2019-10-08 DIAGNOSIS — Y999 Unspecified external cause status: Secondary | ICD-10-CM | POA: Insufficient documentation

## 2019-10-08 DIAGNOSIS — Z7984 Long term (current) use of oral hypoglycemic drugs: Secondary | ICD-10-CM | POA: Diagnosis not present

## 2019-10-08 DIAGNOSIS — W268XXA Contact with other sharp object(s), not elsewhere classified, initial encounter: Secondary | ICD-10-CM | POA: Diagnosis not present

## 2019-10-08 DIAGNOSIS — Y929 Unspecified place or not applicable: Secondary | ICD-10-CM | POA: Diagnosis not present

## 2019-10-08 DIAGNOSIS — S61217A Laceration without foreign body of left little finger without damage to nail, initial encounter: Secondary | ICD-10-CM | POA: Diagnosis not present

## 2019-10-08 DIAGNOSIS — S60222A Contusion of left hand, initial encounter: Secondary | ICD-10-CM | POA: Diagnosis not present

## 2019-10-08 DIAGNOSIS — Z79899 Other long term (current) drug therapy: Secondary | ICD-10-CM | POA: Insufficient documentation

## 2019-10-08 DIAGNOSIS — I4891 Unspecified atrial fibrillation: Secondary | ICD-10-CM | POA: Diagnosis not present

## 2019-10-08 DIAGNOSIS — Y9389 Activity, other specified: Secondary | ICD-10-CM | POA: Insufficient documentation

## 2019-10-08 DIAGNOSIS — E119 Type 2 diabetes mellitus without complications: Secondary | ICD-10-CM | POA: Insufficient documentation

## 2019-10-08 DIAGNOSIS — I10 Essential (primary) hypertension: Secondary | ICD-10-CM | POA: Diagnosis not present

## 2019-10-08 MED ORDER — LIDOCAINE HCL (PF) 1 % IJ SOLN
10.0000 mL | Freq: Once | INTRAMUSCULAR | Status: AC
  Administered 2019-10-08: 10 mL
  Filled 2019-10-08: qty 10

## 2019-10-08 NOTE — ED Notes (Signed)
See triage note  Presents with small laceration noted to left 5 th finger  States she fell caught her finger  This occurred at 330 pm yesterday afternoon

## 2019-10-08 NOTE — Discharge Instructions (Signed)
Follow-up with your primary care provider if any continued problems or concerns.  Your x-rays were negative for any fracture of your hand or fingers.  Keep the area clean and dry.  Leave the dressing that was placed in the ED on for 2 days.  When you take this off you will need to clean it with mild soap and water.  Place a Band-Aid or nonstick dressing to the area.  Suture should be removed in 7 to 10 days.  You may take Tylenol if needed for pain.  Return to the emergency department if any urgent concerns or appearance of infection.

## 2019-10-08 NOTE — ED Triage Notes (Signed)
Pt reports she fell on 6/16 when she was on foot stool and slipped. Pt hit left hand with door. Pt to ED this AM due to laceration to the left 5th finger and bleeding continuing. Pt take blood thinner, Prodaxa.

## 2019-10-08 NOTE — ED Provider Notes (Signed)
[facility] Emergency Department Provider Note   ____________________________________________   First MD Initiated Contact with Patient 10/08/19 [phone]     (approximate)  I have reviewed the triage vital signs and the nursing notes.   HISTORY  Chief Complaint Laceration   HPI Sara Larson is a 74 y.o. female presents to the ED with a laceration x2 to her left fifth finger.  Patient states that she caught her finger on a door.  Patient states that she was on a footstool and slipped.  She denies any head injury or loss of consciousness.  Patient has 2 small lacerations to her left fifth finger along with some pain to her third and fourth finger.  Patient is taking a blood thinner, Pradaxa, and has been unable to get the bleeding under control.  She states that her last tetanus has been within 5 years.       Past Medical History:  Diagnosis Date  . Atrial fibrillation ([place])   . Cancer ([place])    skin ca  . Cancer ([place])    liver ca  . Diabetes mellitus without complication ([place])   . Hypertension   . Myocardial infarct ([place])   . Thyroid disease     There are no problems to display for this patient.   Past Surgical History:  Procedure Laterality Date  . ABDOMINAL HYSTERECTOMY    . ABLATION    . CORONARY ANGIOPLASTY WITH STENT PLACEMENT    . HERNIA REPAIR    . LIVER TRANSPLANT    . REDUCTION MAMMAPLASTY  2003    Prior to Admission medications   Medication Sig Start Date End Date Taking? Authorizing Provider  Artificial Tear Ointment (ULTRA FRESH PM) OINT Apply to eye.    [provider]  atorvastatin (LIPITOR) 80 MG tablet Take by mouth. 08/19/17   [provider]  B Complex Vitamins (VITAMIN B-COMPLEX) TABS Take by mouth.    [provider]  Biotin 1000 MCG tablet Take by mouth.    [provider]  Blood Glucose Monitoring Suppl ([place]) [place] 1 each by XX route as directed 02/18/18    [provider]  Calcium-Magnesium-Vitamin D (CITRACAL CALCIUM+D) [id number] MG-MG-UNIT TB24 Take by mouth.    [provider]  candesartan (ATACAND) 16 MG tablet Take by mouth. 10/22/17 10/22/18  [provider]  Cholecalciferol (VITAMIN D3) 25 MCG (1000 UT) CAPS Take by mouth.    [provider]  clopidogrel (PLAVIX) 75 MG tablet Take by mouth. 08/01/17   [provider]  dabigatran (PRADAXA) 150 MG CAPS capsule Take by mouth. 04/07/18   [provider]  glipiZIDE (GLUCOTROL XL) 2.5 MG 24 hr tablet Take by mouth. 01/14/18 01/14/19  [provider]  hydrocortisone 2.5 % lotion Apply topically.    [provider]  ipratropium (ATROVENT) 0.03 % nasal spray Place into the nose. 12/26/17 12/26/18  [provider]  levothyroxine (SYNTHROID, LEVOTHROID) 137 MCG tablet Take by mouth. 04/24/18   [provider]  magnesium oxide (MAG-OX) 400 MG tablet Take by mouth. 09/14/09   [provider]  metoprolol succinate (TOPROL-XL) 100 MG 24 hr tablet Take by mouth. 03/29/17   [provider]  nitroGLYCERIN (NITROSTAT) 0.4 MG SL tablet Place under the tongue. 07/31/17   [provider]  pantoprazole (PROTONIX) 40 MG tablet Take by mouth. 02/12/18   [provider]  polyethylene glycol powder (GLYCOLAX/MIRALAX) 17 GM/SCOOP powder Take 17 g by mouth daily  as needed for moderate constipation. 11/30/18   Harvest Dark, MD  tacrolimus (PROGRAF) 0.5 MG capsule Take 0.5 mg (1 capsule) BID, alternating days with 0.5mg  (1 capsule) am and 1.0 mg (2 capsules) the next day - PO 09/26/17   [provider]  venlafaxine XR (EFFEXOR-XR) 75 MG 24 hr capsule Take by mouth. 04/07/18   [provider]    Allergies Iodinated diagnostic agents and Lisinopril  Family History  Problem Relation Age of Onset  . Breast cancer Paternal Grandmother 29    Social History Social History   Tobacco Use  .  Smoking status: Never Smoker  . Smokeless tobacco: Never Used  Substance Use Topics  . Alcohol use: Not Currently  . Drug use: Never    Review of Systems Constitutional: No fever/chills Eyes: No visual changes. ENT: No trauma. Cardiovascular: Denies chest pain. Respiratory: Denies shortness of breath. Gastrointestinal: No abdominal pain.  No nausea, no vomiting. Musculoskeletal: Positive left hand pain. Skin: Positive laceration left fifth finger. Neurological: Negative for headaches, focal weakness or numbness. ____________________________________________   PHYSICAL EXAM:  VITAL SIGNS: ED Triage Vitals [10/08/19 0223]  Enc Vitals Group     BP (!) 144/85     Pulse Rate (!) 58     Resp 20     Temp 97.9 F (36.6 C)     Temp Source Oral     SpO2 98 %     Weight      Height      Head Circumference      Peak Flow      Pain Score      Pain Loc      Pain Edu?      Excl. in [place]?     Constitutional: Alert and oriented. Well appearing and in no acute distress. Eyes: Conjunctivae are normal.  Head: Atraumatic. Neck: No stridor.  No cervical tenderness on palpation posteriorly. Cardiovascular: Normal rate, regular rhythm. Grossly normal heart sounds.  Good peripheral circulation. Respiratory: Normal respiratory effort.  No retractions. Lungs CTAB. Gastrointestinal: Soft and nontender. No distention.  Musculoskeletal: Third fourth fifth digits are tender with soft tissue edema.  Patient is able to move digits slowly and flex and extend.  Motor sensory function intact.  Capillary refills less than 3 seconds. Neurologic:  Normal speech and language. No gross focal neurologic deficits are appreciated. No gait instability. Skin:  Skin is warm, dry.  Laceration x2 left fifth digit. Psychiatric: Mood and affect are normal. Speech and behavior are normal.  ____________________________________________   LABS (all labs ordered are listed, but only abnormal results are  displayed)  Labs Reviewed - No data to display  RADIOLOGY   Official radiology report(s): DG Hand Complete Left  Result Date: 10/08/2019 CLINICAL DATA:  Fall, laceration to little finger EXAM: LEFT HAND - COMPLETE 3+ VIEW COMPARISON:  None. FINDINGS: There is no evidence of fracture or dislocation. There is no evidence of arthropathy or other focal bone abnormality. Soft tissues are unremarkable. IMPRESSION: No acute bony abnormality. Electronically Signed   By: Rolm Baptise M.D.   On: 10/08/2019 02:48    ____________________________________________   PROCEDURES  Procedure(s) performed (including Critical Care):  Marland KitchenMarland KitchenLaceration Repair  Date/Time: 10/08/2019 8:52 AM Performed by: Johnn Hai, PA-C Authorized by: Johnn Hai, PA-C   Consent:    Consent obtained:  Verbal   Consent given by:  Patient   Risks discussed:  Poor wound healing, infection and pain Anesthesia (see MAR for exact dosages):  Anesthesia method:  Nerve block   Block location:  Left fifth digit   Block needle gauge:  25 G   Block anesthetic:  Lidocaine 1% w/o epi   Block injection procedure:  Introduced needle, incremental injection and negative aspiration for blood   Block outcome:  Anesthesia achieved Laceration details:    Location:  Finger   Finger location:  L small finger   Length (cm):  1 Repair type:    Repair type:  Simple Pre-procedure details:    Preparation:  Patient was prepped and draped in usual sterile fashion Exploration:    Hemostasis achieved with:  Direct pressure   Contaminated: no   Treatment:    Area cleansed with:  Saline   Amount of cleaning:  Standard   Irrigation solution:  Sterile saline   Irrigation volume:  60 ml   Irrigation method:  Syringe   Visualized foreign bodies/material removed: no   Skin repair:    Repair method:  Sutures   Suture size:  5-0   Suture material:  Nylon   Suture technique:  Simple interrupted   Number of sutures:   2 Approximation:    Approximation:  Close Post-procedure details:    Dressing:  Non-adherent dressing   Patient tolerance of procedure:  Tolerated well, no immediate complications  .Marland KitchenLaceration Repair  Date/Time: 10/08/2019 10:23 AM Performed by: Johnn Hai, PA-C Authorized by: Johnn Hai, PA-C   Consent:    Consent obtained:  Verbal   Consent given by:  Patient   Risks discussed:  Pain, infection and poor wound healing Anesthesia (see MAR for exact dosages):    Anesthesia method:  Nerve block   Block needle gauge:  25 G   Block anesthetic:  Lidocaine 1% w/o epi   Block technique:  Left fifth digit   Block injection procedure:  Incremental injection, introduced needle and negative aspiration for blood   Block outcome:  Anesthesia achieved Laceration details:    Location:  Finger   Finger location:  L small finger   Length (cm):  1 Repair type:    Repair type:  Simple Pre-procedure details:    Preparation:  Patient was prepped and draped in usual sterile fashion and imaging obtained to evaluate for foreign bodies Exploration:    Hemostasis achieved with:  Direct pressure   Contaminated: no   Treatment:    Area cleansed with:  Saline   Amount of cleaning:  Standard   Irrigation solution:  Sterile saline   Irrigation volume:  30 cc   Irrigation method:  Syringe   Visualized foreign bodies/material removed: no   Skin repair:    Repair method:  Sutures   Suture size:  5-0   Suture material:  Nylon   Suture technique:  Simple interrupted   Number of sutures:  2 Approximation:    Approximation:  Close Post-procedure details:    Dressing:  Non-adherent dressing   Patient tolerance of procedure:  Tolerated well, no immediate complications     ____________________________________________   INITIAL IMPRESSION / ASSESSMENT AND PLAN / ED COURSE  As part of my medical decision making, I reviewed the following data within the electronic MEDICAL RECORD NUMBER  Notes from prior ED visits and [place] Controlled Substance Database  74 year old female presents to the ED after she fell at home cutting her left fifth finger and injuring her third fourth and fifth finger in the fall.  Patient states that she was on a stepstool reaching for something when she  lost her balance.  She reached for a door to keep from falling cutting her finger.  Patient denies any head injury or loss of consciousness.  Patient is able move digits without any difficulty but is guarded with the fifth finger secondary to bleeding.  X-rays were negative for fracture.  Patient tolerated laceration repair well.  A hemastat dressing was applied to the digit to help control bleeding.  Patient was given wound care instructions.  She is to follow-up with her PCP, urgent care or emergency department in 7 to 10 days for suture removal.  ____________________________________________   FINAL CLINICAL IMPRESSION(S) / ED DIAGNOSES  Final diagnoses:  Laceration of left little finger without foreign body without damage to nail, initial encounter  Contusion of left hand, initial encounter     ED Discharge Orders    None       Note:  This document was prepared using Dragon voice recognition software and may include unintentional dictation errors.    Johnn Hai, PA-C 10/08/19 1029    Duffy Bruce, MD 10/12/19 [phone]

## 2019-11-04 ENCOUNTER — Other Ambulatory Visit: Payer: Self-pay

## 2019-11-04 ENCOUNTER — Ambulatory Visit
Admission: RE | Admit: 2019-11-04 | Discharge: 2019-11-04 | Disposition: A | Discharge status: Home / Self Care | Payer: Medicare Other | Source: Ambulatory Visit | Attending: Family Medicine | Admitting: Family Medicine

## 2019-11-04 DIAGNOSIS — Z1231 Encounter for screening mammogram for malignant neoplasm of breast: Secondary | ICD-10-CM | POA: Diagnosis not present

## 2019-11-23 DIAGNOSIS — Z01818 Encounter for other preprocedural examination: Secondary | ICD-10-CM | POA: Insufficient documentation

## 2020-01-11 ENCOUNTER — Ambulatory Visit (INDEPENDENT_AMBULATORY_CARE_PROVIDER_SITE_OTHER): Payer: Medicare Other | Admitting: Internal Medicine

## 2020-01-11 DIAGNOSIS — G4733 Obstructive sleep apnea (adult) (pediatric): Secondary | ICD-10-CM | POA: Diagnosis not present

## 2020-01-11 DIAGNOSIS — I4891 Unspecified atrial fibrillation: Secondary | ICD-10-CM

## 2020-01-11 DIAGNOSIS — Z9989 Dependence on other enabling machines and devices: Secondary | ICD-10-CM | POA: Diagnosis not present

## 2020-01-11 DIAGNOSIS — Z7189 Other specified counseling: Secondary | ICD-10-CM

## 2020-01-11 NOTE — Patient Instructions (Signed)

## 2020-01-11 NOTE — Progress Notes (Signed)
[facility] [place], [place] [address]  Pulmonary Sleep Medicine   Office Visit Note  Patient Name: Sara Larson DOB: 1945-10-29 MRN [id number]    Chief Complaint: Obstructive Sleep Apnea visit  Brief History:  Sara Larson is seen today for follow up The patient has a 8 year history of sleep apnea. Patient is using PAP nightly. She has been in and out of Afib quite a bit lately.She reports difficulty initiating sleep. She gets in bed between 11:30 and 12:30 p.m and does not get up until noon or later. She wakes briefly to urinate several times. The patient feels better after sleeping with PAP.  The patient reports benefiting from PAP use. Reported sleepiness is  improved and the Epworth Sleepiness Score is 2 out of 24. The patient does not take naps but will rest during the day. The patient complains of the following: dry mouth  The compliance download shows excellent compliance with an average use time of 10.2 hours. The AHI is 3.9  The patient does not complain of limb movements disrupting sleep.  ROS  General: (-) fever, (-) chills, (-) night sweat Nose and Sinuses: (-) nasal stuffiness or itchiness, (-) postnasal drip, (-) nosebleeds, (-) sinus trouble. Mouth and Throat: (-) sore throat, (-) hoarseness. Neck: (-) swollen glands, (-) enlarged thyroid, (-) neck pain. Respiratory: - cough, - shortness of breath, - wheezing. Neurologic: + numbness, + tingling. Psychiatric: - anxiety, - depression   Current Medication: Outpatient Encounter Medications as of 01/11/2020  Medication Sig  . Artificial Tear Ointment (ULTRA FRESH PM) OINT Apply to eye.  Marland Kitchen atorvastatin (LIPITOR) 80 MG tablet Take by mouth.  . B Complex Vitamins (VITAMIN B-COMPLEX) TABS Take by mouth.  . Biotin 1000 MCG tablet Take by mouth.  . Blood Glucose Monitoring Suppl (GLUCOCOM BLOOD GLUCOSE MONITOR) DEVI 1 each by XX route as directed  . Calcium-Magnesium-Vitamin D (CITRACAL CALCIUM+D)  600-40-500 MG-MG-UNIT TB24 Take by mouth.  . candesartan (ATACAND) 16 MG tablet Take by mouth.  . Cholecalciferol (VITAMIN D3) 25 MCG (1000 UT) CAPS Take by mouth.  . clopidogrel (PLAVIX) 75 MG tablet Take by mouth.  . dabigatran (PRADAXA) 150 MG CAPS capsule Take by mouth.  Marland Kitchen glipiZIDE (GLUCOTROL XL) 2.5 MG 24 hr tablet Take by mouth.  . hydrocortisone 2.5 % lotion Apply topically.  Marland Kitchen ipratropium (ATROVENT) 0.03 % nasal spray Place into the nose.  . levothyroxine (SYNTHROID, LEVOTHROID) 137 MCG tablet Take by mouth.  . magnesium oxide (MAG-OX) 400 MG tablet Take by mouth.  . metoprolol succinate (TOPROL-XL) 100 MG 24 hr tablet Take by mouth.  . nitroGLYCERIN (NITROSTAT) 0.4 MG SL tablet Place under the tongue.  . pantoprazole (PROTONIX) 40 MG tablet Take by mouth.  . polyethylene glycol powder (GLYCOLAX/MIRALAX) 17 GM/SCOOP powder Take 17 g by mouth daily as needed for moderate constipation.  . tacrolimus (PROGRAF) 0.5 MG capsule Take 0.5 mg (1 capsule) BID, alternating days with 0.5mg  (1 capsule) am and 1.0 mg (2 capsules) the next day - PO  . venlafaxine XR (EFFEXOR-XR) 75 MG 24 hr capsule Take by mouth.   No facility-administered encounter medications on file as of 01/11/2020.    Surgical History: Past Surgical History:  Procedure Laterality Date  . ABDOMINAL HYSTERECTOMY    . ABLATION    . CORONARY ANGIOPLASTY WITH STENT PLACEMENT    . HERNIA REPAIR    . LIVER TRANSPLANT    . REDUCTION MAMMAPLASTY  2003    Medical History: Past  Medical History:  Diagnosis Date  . Atrial fibrillation ([place])   . Cancer ([place])    skin ca  . Cancer ([place])    liver ca  . Diabetes mellitus without complication ([place])   . Hypertension   . Myocardial infarct ([place])   . Thyroid disease     Family History: Non contributory to the present illness  Social History: Social History   Socioeconomic History  . Marital status: Widowed    Spouse name: Not on file  . Number of children: Not on file   . Years of education: Not on file  . Highest education level: Not on file  Occupational History  . Not on file  Tobacco Use  . Smoking status: Never Smoker  . Smokeless tobacco: Never Used  Substance and Sexual Activity  . Alcohol use: Not Currently  . Drug use: Never  . Sexual activity: Not on file  Other Topics Concern  . Not on file  Social History Narrative  . Not on file   Social Determinants of Health   Financial Resource Strain:   . Difficulty of Paying Living Expenses: Not on file  Food Insecurity:   . Worried About Charity fundraiser in the Last Year: Not on file  . Ran Out of Food in the Last Year: Not on file  Transportation Needs:   . Lack of Transportation (Medical): Not on file  . Lack of Transportation (Non-Medical): Not on file  Physical Activity:   . Days of Exercise per Week: Not on file  . Minutes of Exercise per Session: Not on file  Stress:   . Feeling of Stress : Not on file  Social Connections:   . Frequency of Communication with Friends and Family: Not on file  . Frequency of Social Gatherings with Friends and Family: Not on file  . Attends Religious Services: Not on file  . Active Member of Clubs or Organizations: Not on file  . Attends Archivist Meetings: Not on file  . Marital Status: Not on file  Intimate Partner Violence:   . Fear of Current or Ex-Partner: Not on file  . Emotionally Abused: Not on file  . Physically Abused: Not on file  . Sexually Abused: Not on file    Vital Signs: Blood pressure 109/61, pulse (!) 50, height 5\' 4"  (1.626 m), weight 202 lb (91.6 kg), SpO2 97 %.  Examination: General Appearance: The patient is well-developed, well-nourished, and in no distress. Neck Circumference: 47 Skin: Gross inspection of skin unremarkable. Head: normocephalic, no gross deformities. Eyes: no gross deformities noted. ENT: ears appear grossly normal Neurologic: Alert and oriented. No involuntary  movements.    EPWORTH SLEEPINESS SCALE:  Scale:  (0)= no chance of dozing; (1)= slight chance of dozing; (2)= moderate chance of dozing; (3)= high chance of dozing  Chance  Situtation    Sitting and reading: 1    Watching TV: 0    Sitting Inactive in public: 0    As a passenger in car: 1      Lying down to rest: 0    Sitting and talking: 0    Sitting quielty after lunch: 0    In a car, stopped in traffic: 0   TOTAL SCORE:   2 out of 24    SLEEP STUDIES:  1. PSG 10/2011 AHI 35 SpO61min 80%   CPAP COMPLIANCE DATA:  Date Range: 01/06/19-01/05/20  Average Daily Use: 10.2 hours  Median Use: 10.4  Compliance for >  4 Hours: 99%  AHI: 3.9 respiratory events per hour  Days Used: 365/365  Mask Leak: 14.3  95th Percentile Pressure: 12/8 cm H2O  LABS: No results found for this or any previous visit (from the past 2160 hour(s)).  Radiology: MM 3D SCREEN BREAST BILATERAL  Result Date: 11/06/2019 CLINICAL DATA:  Screening. EXAM: DIGITAL SCREENING BILATERAL MAMMOGRAM WITH TOMO AND CAD COMPARISON:  Previous exam(s). ACR Breast Density Category a: The breast tissue is almost entirely fatty. FINDINGS: There are no findings suspicious for malignancy. Images were processed with CAD. IMPRESSION: No mammographic evidence of malignancy. A result letter of this screening mammogram will be mailed directly to the patient. RECOMMENDATION: Screening mammogram in one year. (Code:SM-B-01Y) BI-RADS CATEGORY  1: Negative. Electronically Signed   By: Ammie Ferrier M.D.   On: 11/06/2019 10:06    Assessment and Plan: Patient Active Problem List   Diagnosis Date Noted  . OSA on CPAP 01/11/2020  . CPAP use counseling 01/11/2020  . Atrial fibrillation ([place]) 01/11/2020      The patient does tolerate PAP and reports significant benefit from PAP use. The patient is caring for her machine as recommended. She was advised to avoid going to bed when not sleepy and to limit time in bed.  She may benefit from xylimelts for her dry mouth. The patient is trying to get regular exercise. The compliance is excellent. The apnea is well controlled. She has frequent Afib which can cause some elevation of the AHI   1. OSA- continue excellent compliance. Limit time in bed and avoid going to bed if not sleepy. 2. CPAP counseling: Discussed proper use, care and cleaning procedures of her current CPAP machine. 3. A. Fib-Remains stable at this time, continue with routine follow-up with cardiology.  General Counseling: I have discussed the findings of the evaluation and examination with [facility].  I have also discussed any further diagnostic evaluation thatmay be needed or ordered today. Masey verbalizes understanding of the findings of todays visit. We also reviewed her medications today and discussed drug interactions and side effects including but not limited excessive drowsiness and altered mental states. We also discussed that there is always a risk not just to her but also people around her. she has been encouraged to call the office with any questions or concerns that should arise related to todays visit.   I have personally obtained a history, examined the patient, evaluated laboratory and imaging results, formulated the assessment and plan and placed orders.  This patient was seen by Theodoro Grist AGNP-C in Collaboration with Dr.Draysen Weygandt as a part of collaborative care agreement.  Lavera Guise, MD Signed electronically reviewed  Richelle Ito Saunders Glance, PhD, FAASM  Diplomate, American Board of Sleep Medicine    Allyne Gee, MD [facility] Diplomate ABMS Pulmonary and Critical Care Medicine Sleep medicine

## 2020-02-29 DIAGNOSIS — I5032 Chronic diastolic (congestive) heart failure: Secondary | ICD-10-CM | POA: Insufficient documentation

## 2020-04-07 DIAGNOSIS — R899 Unspecified abnormal finding in specimens from other organs, systems and tissues: Secondary | ICD-10-CM | POA: Insufficient documentation

## 2020-07-22 ENCOUNTER — Other Ambulatory Visit: Payer: Self-pay | Admitting: Nurse Practitioner

## 2020-07-22 DIAGNOSIS — N183 Chronic kidney disease, stage 3 unspecified: Secondary | ICD-10-CM | POA: Insufficient documentation

## 2020-07-22 DIAGNOSIS — E1122 Type 2 diabetes mellitus with diabetic chronic kidney disease: Secondary | ICD-10-CM | POA: Insufficient documentation

## 2020-07-22 DIAGNOSIS — K7581 Nonalcoholic steatohepatitis (NASH): Secondary | ICD-10-CM | POA: Insufficient documentation

## 2020-07-22 DIAGNOSIS — Z944 Liver transplant status: Secondary | ICD-10-CM

## 2020-07-25 ENCOUNTER — Telehealth: Payer: Self-pay | Admitting: Adult Health

## 2020-07-25 NOTE — Telephone Encounter (Signed)
I called patient to discuss Evusheld, a long acting monoclonal antibody injection administered to patients with decreased immune systems or intolerance/allergy to the COVID 19 vaccine as COVID19 prevention.    I reached the patient on the phone, and she noted, that she didn't have enough time or money to help me out, then hung up.    I reached out to Beckey Rutter who had placed the order, and said that her son coordinates her care, so I called Colin Mulders and spoke with him.  Brendaly received Evusheld on 07/07/2020, so an injection isn't needed at this time.    Wilber Bihari, NP

## 2020-10-05 ENCOUNTER — Other Ambulatory Visit: Payer: Self-pay | Admitting: Family Medicine

## 2020-10-05 DIAGNOSIS — Z1231 Encounter for screening mammogram for malignant neoplasm of breast: Secondary | ICD-10-CM

## 2020-11-07 ENCOUNTER — Ambulatory Visit
Admission: RE | Admit: 2020-11-07 | Discharge: 2020-11-07 | Disposition: A | Discharge status: Home / Self Care | Payer: Medicare Other | Source: Ambulatory Visit | Attending: Family Medicine | Admitting: Family Medicine

## 2020-11-07 ENCOUNTER — Other Ambulatory Visit: Payer: Self-pay

## 2020-11-07 DIAGNOSIS — Z1231 Encounter for screening mammogram for malignant neoplasm of breast: Secondary | ICD-10-CM | POA: Insufficient documentation

## 2020-12-23 DIAGNOSIS — I4891 Unspecified atrial fibrillation: Secondary | ICD-10-CM | POA: Insufficient documentation

## 2020-12-30 NOTE — Progress Notes (Signed)
[facility] [place], [place] [address]  Pulmonary Sleep Medicine   Office Visit Note  Patient Name: CLARITZA SCHNARS DOB: June 16, 1945 MRN [id number]    Chief Complaint: Obstructive Sleep Apnea visit  Brief History:  Temekia is seen today for one year follow up The patient has a 9 history of sleep apnea. Patient is using PAP nightly @ 12/8cmH2O and was in/out of Afib at last years consult. .  The patient feels better after sleeping with PAP.  The patient reports benefiting from PAP use. Reported sleepiness is  improved and the Epworth Sleepiness Score is 2 out of 24. The patient does not take naps. The patient complains of the following: recurring Afib episodes with slurred. The compliance download shows  compliance with an average use time of 9:27 hours@ 99% compliance. The AHI is 5.0  The patient does not complain of limb movements disrupting sleep.  ROS  General: (-) fever, (-) chills, (-) night sweat Nose and Sinuses: (-) nasal stuffiness or itchiness, (-) postnasal drip, (-) nosebleeds, (-) sinus trouble. Mouth and Throat: (-) sore throat, (-) hoarseness. Neck: (-) swollen glands, (-) enlarged thyroid, (-) neck pain. Respiratory: - cough, - shortness of breath, - wheezing. Neurologic: - numbness, - tingling. Psychiatric: -anxiety, - depression   Current Medication: Outpatient Encounter Medications as of 01/02/2021  Medication Sig   Artificial Tear Ointment (ULTRA FRESH PM) OINT Apply to eye.   atorvastatin (LIPITOR) 80 MG tablet Take by mouth.   B Complex Vitamins (VITAMIN B-COMPLEX) TABS Take by mouth.   Biotin 1000 MCG tablet Take by mouth.   Blood Glucose Monitoring Suppl (GLUCOCOM BLOOD GLUCOSE MONITOR) DEVI 1 each by XX route as directed   Calcium-Magnesium-Vitamin D (CITRACAL CALCIUM+D) 600-40-500 MG-MG-UNIT TB24 Take by mouth.   candesartan (ATACAND) 16 MG tablet Take by mouth.   Cholecalciferol (VITAMIN D3) 25 MCG (1000 UT) CAPS Take by mouth.    clopidogrel (PLAVIX) 75 MG tablet Take by mouth.   dabigatran (PRADAXA) 150 MG CAPS capsule Take by mouth.   glipiZIDE (GLUCOTROL XL) 2.5 MG 24 hr tablet Take by mouth.   hydrocortisone 2.5 % lotion Apply topically.   ipratropium (ATROVENT) 0.03 % nasal spray Place into the nose.   levothyroxine (SYNTHROID, LEVOTHROID) 137 MCG tablet Take by mouth.   magnesium oxide (MAG-OX) 400 MG tablet Take by mouth.   metoprolol succinate (TOPROL-XL) 100 MG 24 hr tablet Take by mouth.   nitroGLYCERIN (NITROSTAT) 0.4 MG SL tablet Place under the tongue.   pantoprazole (PROTONIX) 40 MG tablet Take by mouth.   polyethylene glycol powder (GLYCOLAX/MIRALAX) 17 GM/SCOOP powder Take 17 g by mouth daily as needed for moderate constipation.   tacrolimus (PROGRAF) 0.5 MG capsule Take 0.5 mg (1 capsule) BID, alternating days with 0.'5mg'$  (1 capsule) am and 1.0 mg (2 capsules) the next day - PO   venlafaxine XR (EFFEXOR-XR) 75 MG 24 hr capsule Take by mouth.   No facility-administered encounter medications on file as of 01/02/2021.    Surgical History: Past Surgical History:  Procedure Laterality Date   ABDOMINAL HYSTERECTOMY     ABLATION     CORONARY ANGIOPLASTY WITH STENT PLACEMENT     HERNIA REPAIR     LIVER TRANSPLANT     REDUCTION MAMMAPLASTY  2003    Medical History: Past Medical History:  Diagnosis Date   Atrial fibrillation ([place])    Cancer ([place])    skin ca   Cancer ([place])    liver ca  Diabetes mellitus without complication ([place])    Hypertension    Myocardial infarct ([place])    Thyroid disease     Family History: Non contributory to the present illness  Social History: Social History   Socioeconomic History   Marital status: Widowed    Spouse name: Not on file   Number of children: Not on file   Years of education: Not on file   Highest education level: Not on file  Occupational History   Not on file  Tobacco Use   Smoking status: Never   Smokeless tobacco: Never  Substance and  Sexual Activity   Alcohol use: Not Currently   Drug use: Never   Sexual activity: Not on file  Other Topics Concern   Not on file  Social History Narrative   Not on file   Social Determinants of Health   Financial Resource Strain: Not on file  Food Insecurity: Not on file  Transportation Needs: Not on file  Physical Activity: Not on file  Stress: Not on file  Social Connections: Not on file  Intimate Partner Violence: Not on file    Vital Signs: There were no vitals taken for this visit.  Examination: General Appearance: The patient is well-developed, well-nourished, and in no distress. Neck Circumference: 43cm Skin: Gross inspection of skin unremarkable. Head: normocephalic, no gross deformities. Eyes: no gross deformities noted. ENT: ears appear grossly normal Neurologic: Alert and oriented. No involuntary movements.    EPWORTH SLEEPINESS SCALE:  Scale:  (0)= no chance of dozing; (1)= slight chance of dozing; (2)= moderate chance of dozing; (3)= high chance of dozing  Chance  Situtation    Sitting and reading: 0    Watching TV: 0    Sitting Inactive in public: 0    As a passenger in car: 1      Lying down to rest: 1    Sitting and talking: 0    Sitting quielty after lunch: 0    In a car, stopped in traffic: 0   TOTAL SCORE:   2 out of 24    SLEEP STUDIES:  PSG 10/2011  - AHI 35 SpO20mn 80%,     CPAP COMPLIANCE DATA:  Date Range: 12/30/19 - 12/28/20  Average Daily Use: 9:27 hours  Median Use: 9:47  Compliance for > 4 Hours: 99%  AHI: 5.0 respiratory events per hour  Days Used: 360/365  Mask Leak: 23.3  95th Percentile Pressure: 12/8 cmH2O         LABS: No results found for this or any previous visit (from the past 2160 hour(s)).  Radiology: MM 3D SCREEN BREAST BILATERAL  Result Date: 11/08/2020 CLINICAL DATA:  Screening. EXAM: DIGITAL SCREENING BILATERAL MAMMOGRAM WITH TOMOSYNTHESIS AND CAD TECHNIQUE: Bilateral screening  digital craniocaudal and mediolateral oblique mammograms were obtained. Bilateral screening digital breast tomosynthesis was performed. The images were evaluated with computer-aided detection. COMPARISON:  Previous exam(s). ACR Breast Density Category b: There are scattered areas of fibroglandular density. FINDINGS: There are no findings suspicious for malignancy. IMPRESSION: No mammographic evidence of malignancy. A result letter of this screening mammogram will be mailed directly to the patient. RECOMMENDATION: Screening mammogram in one year. (Code:SM-B-01Y) BI-RADS CATEGORY  1: Negative. Electronically Signed   By: DLillia MountainM.D.   On: 11/08/2020 15:07   No results found.  No results found.    Assessment and Plan: Patient Active Problem List   Diagnosis Date Noted   Nonalcoholic steatohepatitis ([place]) 07/22/2020   Type 2 diabetes mellitus  with stage 3 chronic kidney disease ([place]) 07/22/2020   Abnormal laboratory test result 04/07/2020   Chronic diastolic heart failure ([place]) 02/29/2020   Preoperative evaluation of a medical condition to rule out surgical contraindications (TAR required) 11/23/2019   Enthesopathy of left hip region [id number]   Umbilical hernia without obstruction and without gangrene 06/24/2019   Liver transplant status ([place]) 06/09/2019   Trochanteric bursitis, left hip 06/09/2019   Left hip pain 03/31/2019   Glenohumeral arthritis 01/26/2019   S/P reverse total shoulder arthroplasty, right 01/26/2019   Anemia 12/31/2018   Bradycardia 12/31/2018   CKD (chronic kidney disease) stage 3, GFR 30-59 ml/min (HCC) 12/31/2018   Hyperlipidemia 12/31/2018   Insomnia 12/31/2018   Rotator cuff tear arthropathy of right shoulder 12/22/2018   Sprain of rotator cuff capsule 12/22/2018   History of MI (myocardial infarction) 07/30/2017   Immunosuppressed status ([place]) 05/20/2017   PAF (paroxysmal atrial fibrillation) ([place]) 09/13/2016   Chronic anticoagulation 03/04/2016    Status post catheter ablation of atrial fibrillation 03/04/2016   Chronic bilateral low back pain with left-sided sciatica 01/04/2016   Obstructive sleep apnea syndrome 10/19/2015   Lumbar stenosis 09/03/2012   Acquired hypothyroidism 12/28/2010   Coronary artery disease involving native coronary artery of native heart without angina pectoris 12/28/2010   Depression 12/28/2010   Essential hypertension 12/28/2010   Hypertriglyceridemia without hypercholesterolemia 12/28/2010   1. OSA treated with BiPAP The patient does  tolerate PAP and reports  benefit from PAP use. The patient was reminded how to clean equipment and advised to replace supplies routinely. Due to slight elevation in her AHI over last year, will increase IPAP to 13 and do a 2 week download. The patient was also counselled on weight loss. The compliance is excellent . The AHI is 5.0.   OSA- will adjust IPAP to 13. 2 week download. F/u 1 year.    2. PAF (paroxysmal atrial fibrillation) ([place]) [place] of [place] for [place], multiple ablations, she is aware of the importance of treating the apnea in light of same.   3. Essential hypertension Hypertension Counseling:   The following hypertensive lifestyle modification were recommended and discussed:  1. Limiting alcohol intake to less than 1 oz/day of ethanol:(24 oz of beer or 8 oz of wine or 2 oz of 100-proof whiskey). 2. Take baby ASA 81 mg daily. 3. Importance of regular aerobic exercise and losing weight. 4. Reduce dietary saturated fat and cholesterol intake for overall cardiovascular health. 5. Maintaining adequate dietary potassium, calcium, and magnesium intake. 6. Regular monitoring of the blood pressure. 7. Reduce sodium intake to less than 100 mmol/day (less than 2.3 gm of sodium or less than 6 gm of sodium choride)       General Counseling: I have discussed the findings of the evaluation and examination with Constance Holster.  I have also discussed any further  diagnostic evaluation thatmay be needed or ordered today. Lanisa verbalizes understanding of the findings of todays visit. We also reviewed her medications today and discussed drug interactions and side effects including but not limited excessive drowsiness and altered mental states. We also discussed that there is always a risk not just to her but also people around her. she has been encouraged to call the office with any questions or concerns that should arise related to todays visit.  No orders of the defined types were placed in this encounter.       I have personally obtained a history, examined the patient, evaluated laboratory and  imaging results, formulated the assessment and plan and placed orders.   This patient was seen today by Tressie Ellis, PA-C in collaboration with Dr. Devona Konig.     Allyne Gee, MD [facility] Diplomate ABMS Pulmonary and Critical Care Medicine Sleep medicine

## 2021-01-02 ENCOUNTER — Ambulatory Visit (INDEPENDENT_AMBULATORY_CARE_PROVIDER_SITE_OTHER): Payer: Medicare Other | Admitting: Internal Medicine

## 2021-01-02 ENCOUNTER — Other Ambulatory Visit: Payer: Self-pay

## 2021-01-02 VITALS — BP 122/88 | HR 58 | Resp 18 | Ht 65.0 in | Wt 181.0 lb

## 2021-01-02 DIAGNOSIS — I480 Paroxysmal atrial fibrillation: Secondary | ICD-10-CM

## 2021-01-02 DIAGNOSIS — I10 Essential (primary) hypertension: Secondary | ICD-10-CM

## 2021-01-02 DIAGNOSIS — G4733 Obstructive sleep apnea (adult) (pediatric): Secondary | ICD-10-CM | POA: Diagnosis not present

## 2021-01-02 NOTE — Patient Instructions (Signed)

## 2021-02-10 IMAGING — MG DIGITAL SCREENING BILATERAL MAMMOGRAM WITH TOMO AND CAD
8 series · 8 of 24 positions shown · non-contrast
Comparison: Previous exam(s).

CLINICAL DATA: Screening.

EXAM:
DIGITAL SCREENING BILATERAL MAMMOGRAM WITH TOMO AND CAD

[L CC synth-2D]
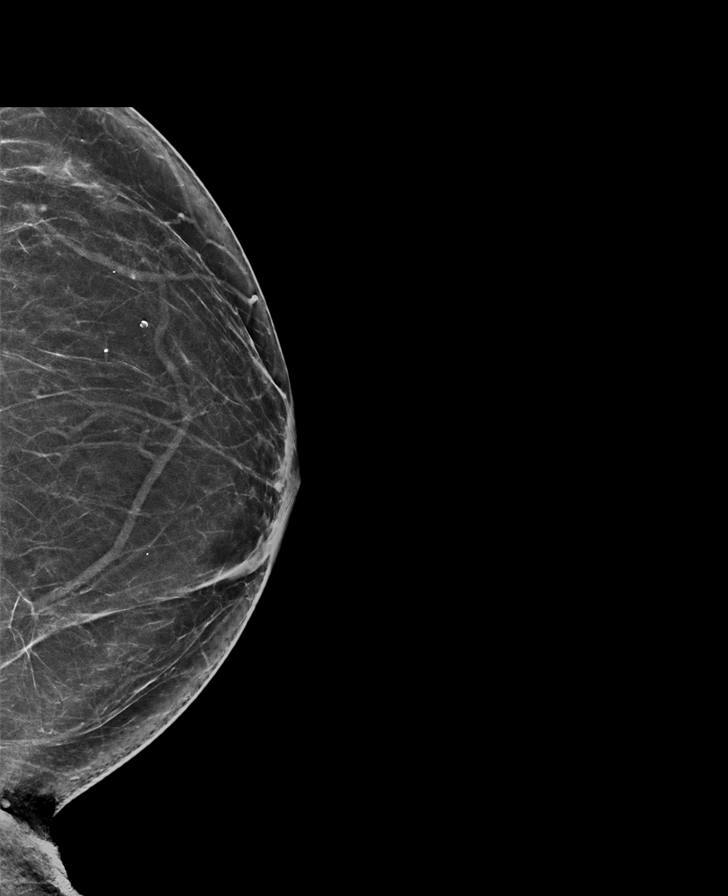

[R CC synth-2D]
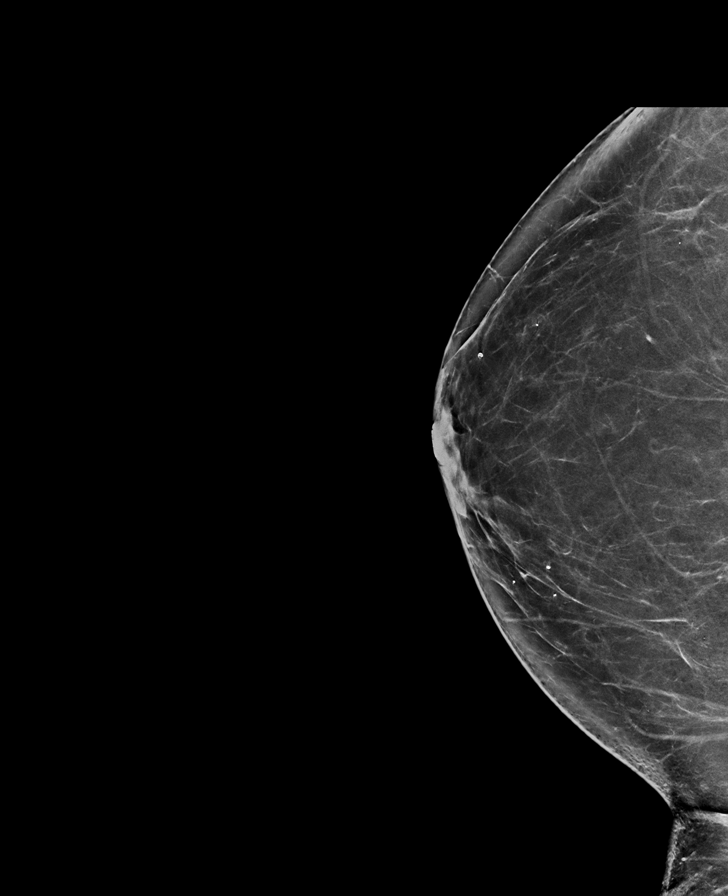

[R MLO synth-2D]
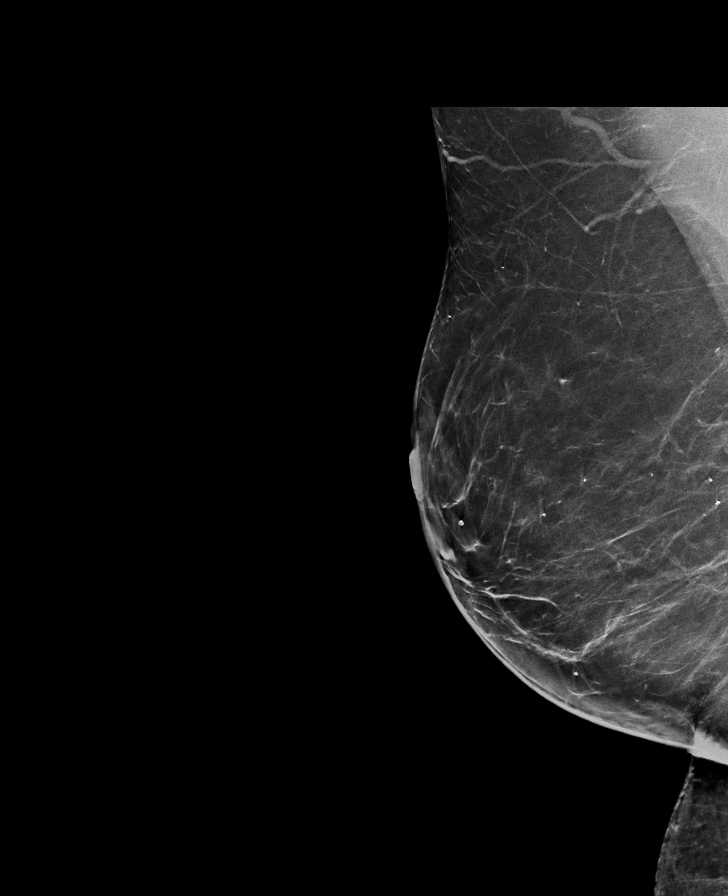

[L MLO synth-2D]
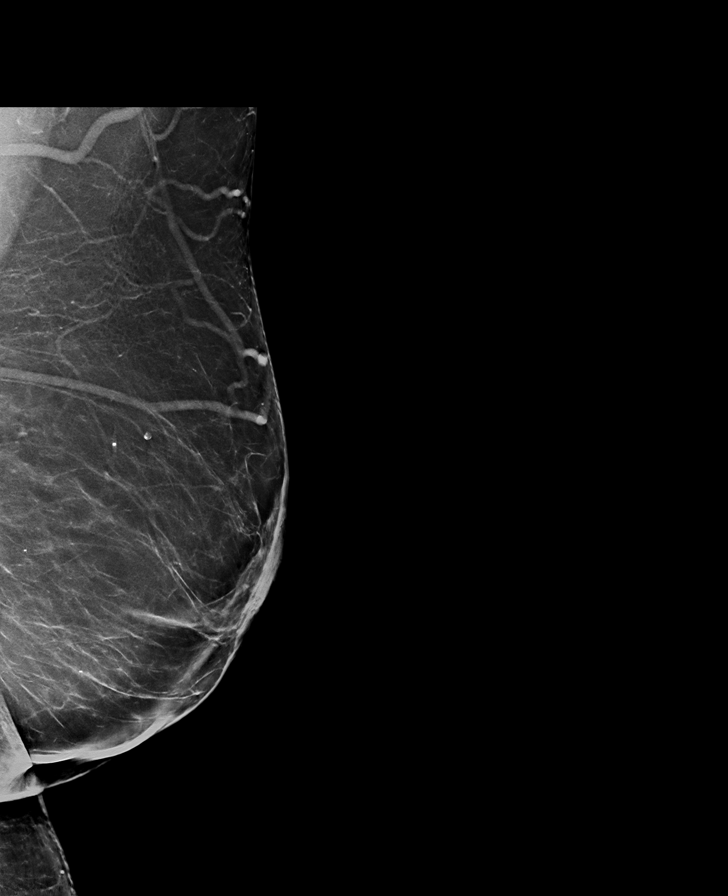

[R MLO tomo · tomo slice 42/83.0]
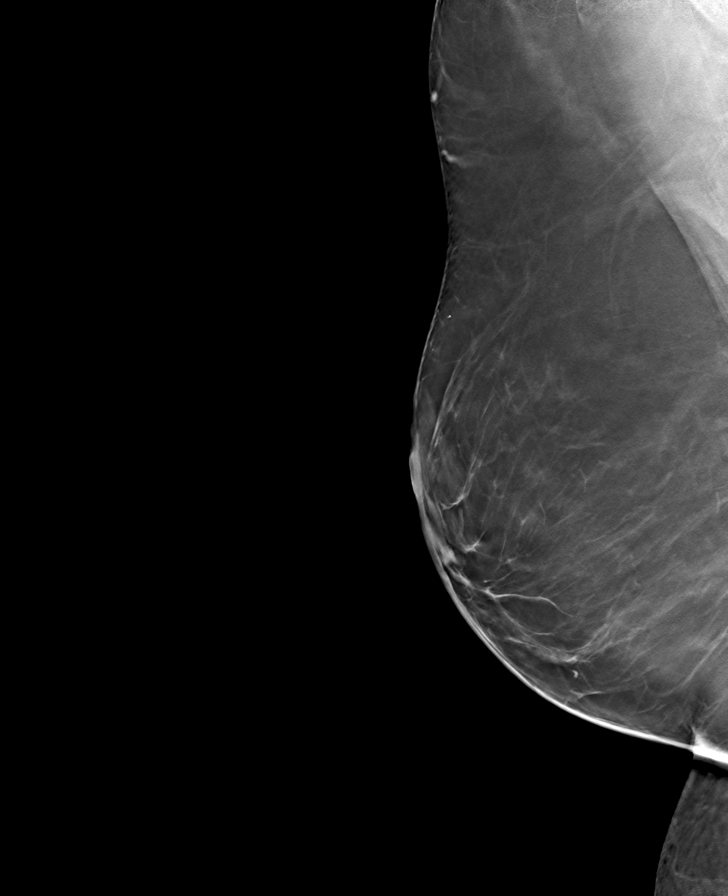

[L CC tomo · tomo slice 37/74.0]
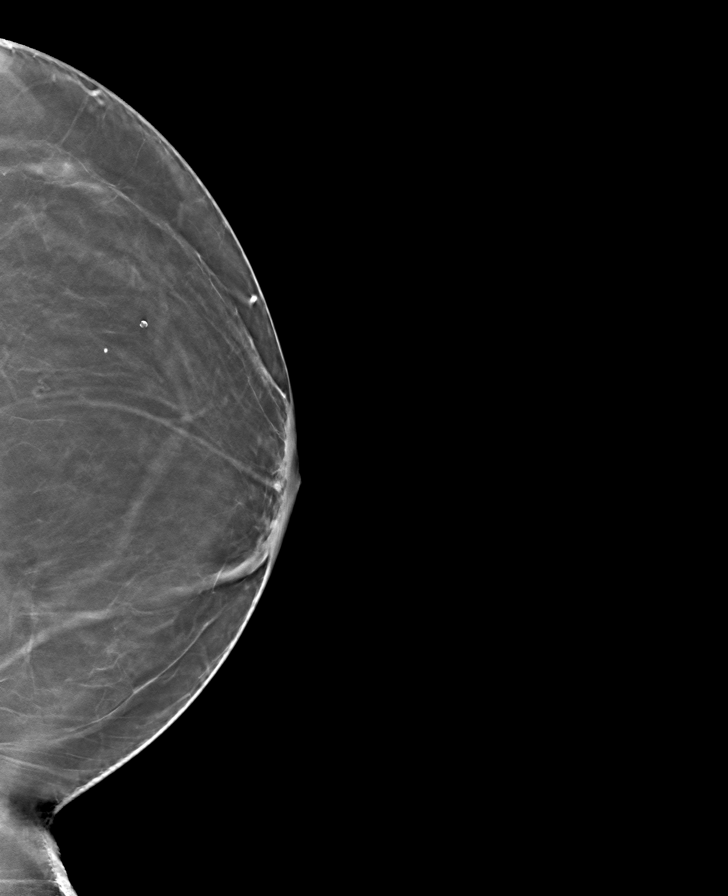

[R CC tomo · tomo slice 39/78.0]
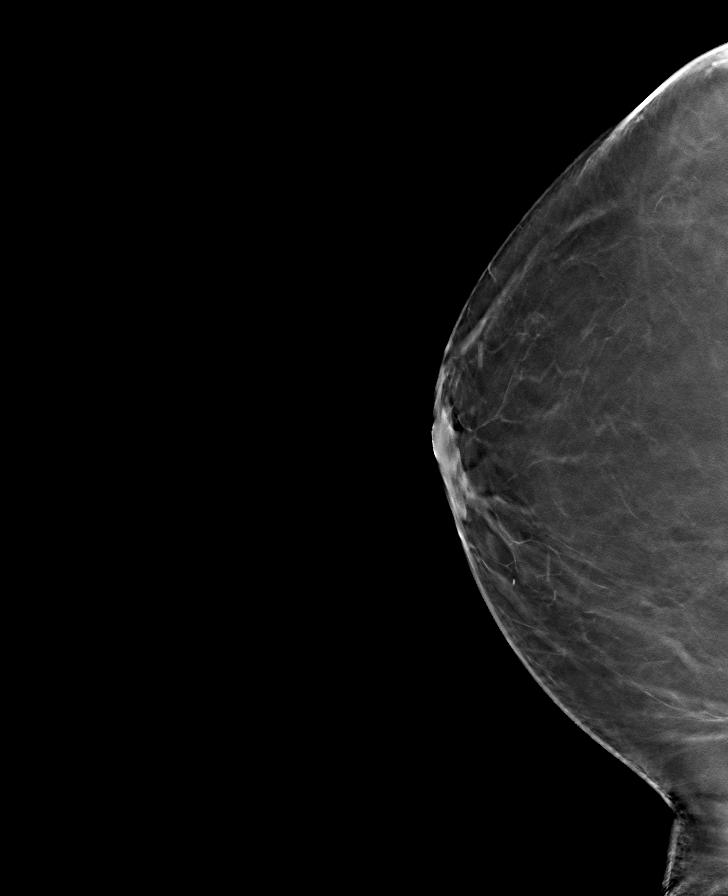

[L MLO tomo · tomo slice 43/86.0]
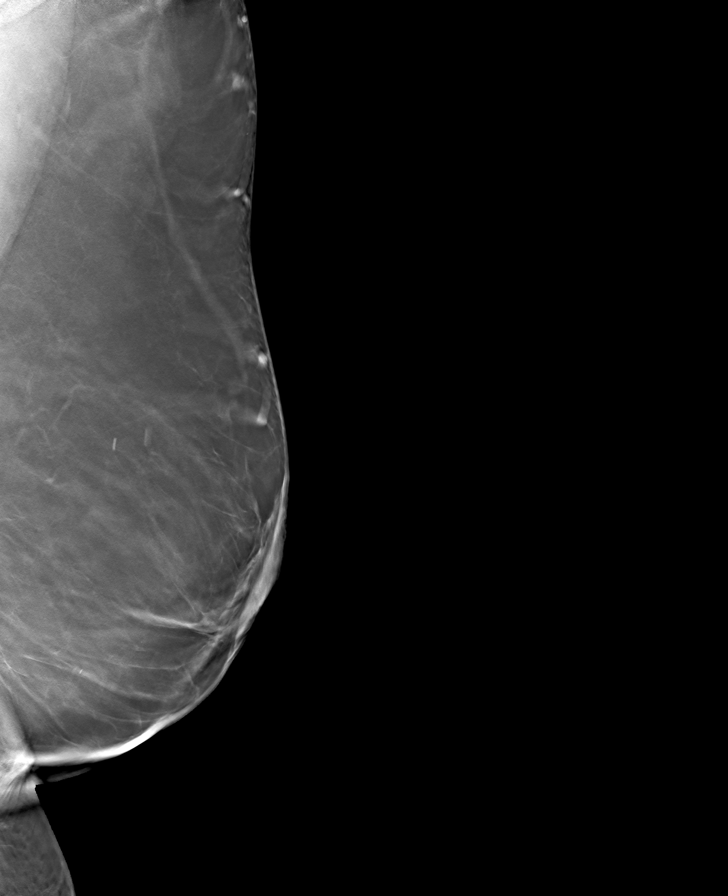

[8 of 24 positions shown; findings below may reference images not displayed]

ACR Breast Density Category b: There are scattered areas of
fibroglandular density.
FINDINGS: There are no findings suspicious for malignancy. Images were
processed with CAD.
IMPRESSION: No mammographic evidence of malignancy. A result letter of this
screening mammogram will be mailed directly to the patient.

RECOMMENDATION:
Screening mammogram in one year. (Code:CN-U-775)

BI-RADS CATEGORY  1: Negative.

## 2021-03-20 ENCOUNTER — Ambulatory Visit (INDEPENDENT_AMBULATORY_CARE_PROVIDER_SITE_OTHER): Payer: Medicare Other | Admitting: Internal Medicine

## 2021-03-20 ENCOUNTER — Other Ambulatory Visit: Payer: Self-pay

## 2021-03-20 ENCOUNTER — Ambulatory Visit: Payer: Medicare Other

## 2021-03-20 VITALS — BP 95/67 | HR 74 | Resp 16 | Ht 65.0 in | Wt 170.0 lb

## 2021-03-20 DIAGNOSIS — I480 Paroxysmal atrial fibrillation: Secondary | ICD-10-CM | POA: Diagnosis not present

## 2021-03-20 DIAGNOSIS — G4733 Obstructive sleep apnea (adult) (pediatric): Secondary | ICD-10-CM

## 2021-03-20 DIAGNOSIS — I10 Essential (primary) hypertension: Secondary | ICD-10-CM | POA: Diagnosis not present

## 2021-03-20 NOTE — Progress Notes (Signed)
[facility] [place], [place] [address]  Pulmonary Sleep Medicine   Office Visit Note  Patient Name: Sara Larson DOB: 28-Feb-1946 MRN [id number]    Chief Complaint: Obstructive Sleep Apnea visit  Brief History:  [name] is seen today for face to face notes needed prior to a sleep study. The patient has a 9 year history of sleep apnea. Patient is using PAP nightly.  The patient feels fine in morning after sleeping with PAP.  The patient reports benefiting from PAP use. Reported sleepiness is  somewhat resolved and the Epworth Sleepiness Score is 2 out of 24. The patient does take naps without PAP. The patient complains of the following: still experiencing Afib despite being on BiPAP and  recent pressure change.  The compliance download shows  compliance with an average use time of 9:14 hours @ 100%. The AHI is 6.3  The patient does not complain of limb movements disrupting sleep.  ROS  General: (-) fever, (-) chills, (-) night sweat Nose and Sinuses: (-) nasal stuffiness or itchiness, (-) postnasal drip, (-) nosebleeds, (-) sinus trouble. Mouth and Throat: (-) sore throat, (-) hoarseness. Neck: (-) swollen glands, (-) enlarged thyroid, (-) neck pain. Respiratory: - cough, - shortness of breath, - wheezing. Neurologic: - numbness, - tingling. Psychiatric: - anxiety, - depression   Current Medication: Outpatient Encounter Medications as of 03/20/2021  Medication Sig   acetaminophen (TYLENOL) 500 MG tablet Take by mouth.   amiodarone (PACERONE) 200 MG tablet Take by mouth.   amoxicillin (AMOXIL) 500 MG tablet DENTAL APPTS ONLY   apixaban (ELIQUIS) 5 MG TABS tablet Take 1 tablet by mouth every 12 (twelve) hours.   Artificial Tear Ointment (ULTRA FRESH PM) OINT Apply to eye.   aspirin 81 MG chewable tablet Chew by mouth.   atorvastatin (LIPITOR) 80 MG tablet Take by mouth.   atorvastatin (LIPITOR) 80 MG tablet Take 1 tablet by mouth daily.   B Complex  Vitamins (VITAMIN B-COMPLEX) TABS Take by mouth.   Biotin 1000 MCG tablet Take by mouth.   Blood Glucose Monitoring Suppl (GLUCOCOM BLOOD GLUCOSE MONITOR) DEVI 1 each by XX route as directed   calcium carbonate (TUMS EX) 750 MG chewable tablet Chew by mouth.   Calcium-Magnesium-Vitamin D (CITRACAL CALCIUM+D) 600-40-500 MG-MG-UNIT TB24 Take by mouth.   candesartan (ATACAND) 16 MG tablet Take by mouth.   Cholecalciferol (VITAMIN D3) 25 MCG (1000 UT) CAPS Take by mouth.   clopidogrel (PLAVIX) 75 MG tablet Take by mouth.   dabigatran (PRADAXA) 150 MG CAPS capsule Take by mouth.   Docusate Sodium (DSS) 100 MG CAPS Take by mouth.   ferrous sulfate 325 (65 FE) MG EC tablet Take by mouth.   furosemide (LASIX) 20 MG tablet Take by mouth.   glipiZIDE (GLUCOTROL XL) 2.5 MG 24 hr tablet Take by mouth.   glipiZIDE (GLUCOTROL XL) 2.5 MG 24 hr tablet Take 1 tablet by mouth daily.   hydrocortisone 2.5 % lotion Apply topically.   ipratropium (ATROVENT) 0.03 % nasal spray Place into the nose.   levothyroxine (SYNTHROID) 200 MCG tablet Take by mouth.   levothyroxine (SYNTHROID, LEVOTHROID) 137 MCG tablet Take by mouth.   magnesium oxide (MAG-OX) 400 MG tablet Take by mouth.   magnesium oxide (MAG-OX) 400 MG tablet Take by mouth.   metoprolol succinate (TOPROL-XL) 100 MG 24 hr tablet Take by mouth.   metoprolol succinate (TOPROL-XL) 100 MG 24 hr tablet Take by mouth.   nitroGLYCERIN (NITROSTAT) 0.4 MG SL  tablet Place under the tongue.   nitroGLYCERIN (NITROSTAT) 0.4 MG SL tablet Place under the tongue.   pantoprazole (PROTONIX) 40 MG tablet Take by mouth.   pantoprazole (PROTONIX) 40 MG tablet Take by mouth.   polyethylene glycol powder (GLYCOLAX/MIRALAX) 17 GM/SCOOP powder Take 17 g by mouth daily as needed for moderate constipation.   Semaglutide,0.25 or 0.5MG /DOS, (OZEMPIC, 0.25 OR 0.5 MG/DOSE,) 2 MG/1.5ML SOPN Inject into the skin.   spironolactone (ALDACTONE) 25 MG tablet Take by mouth.   tacrolimus  (PROGRAF) 0.5 MG capsule Take 0.5 mg (1 capsule) BID, alternating days with 0.5mg  (1 capsule) am and 1.0 mg (2 capsules) the next day - PO   tacrolimus (PROGRAF) 0.5 MG capsule Take by mouth.   traZODone (DESYREL) 50 MG tablet Take by mouth.   venlafaxine XR (EFFEXOR-XR) 75 MG 24 hr capsule Take by mouth.   No facility-administered encounter medications on file as of 03/20/2021.    Surgical History: Past Surgical History:  Procedure Laterality Date   ABDOMINAL HYSTERECTOMY     ABLATION     BACK SURGERY     CORONARY ANGIOPLASTY WITH STENT PLACEMENT     HERNIA REPAIR     LIVER TRANSPLANT     REDUCTION MAMMAPLASTY  2003    Medical History: Past Medical History:  Diagnosis Date   Atrial fibrillation ([place])    Cancer ([place])    skin ca   Cancer ([place])    liver ca   Diabetes mellitus without complication ([place])    Hypertension    Myocardial infarct ([place])    Thyroid disease     Family History: Non contributory to the present illness  Social History: Social History   Socioeconomic History   Marital status: Widowed    Spouse name: Not on file   Number of children: Not on file   Years of education: Not on file   Highest education level: Not on file  Occupational History   Not on file  Tobacco Use   Smoking status: Never   Smokeless tobacco: Never  Substance and Sexual Activity   Alcohol use: Not Currently   Drug use: Never   Sexual activity: Not on file  Other Topics Concern   Not on file  Social History Narrative   Not on file   Social Determinants of Health   Financial Resource Strain: Not on file  Food Insecurity: Not on file  Transportation Needs: Not on file  Physical Activity: Not on file  Stress: Not on file  Social Connections: Not on file  Intimate Partner Violence: Not on file    Vital Signs: There were no vitals taken for this visit. There is no height or weight on file to calculate BMI.    Examination: General Appearance: The patient is  well-developed, well-nourished, and in no distress. Neck Circumference: 41 cm Skin: Gross inspection of skin unremarkable. Head: normocephalic, no gross deformities. Eyes: no gross deformities noted. ENT: ears appear grossly normal Neurologic: Alert and oriented. No involuntary movements.    EPWORTH SLEEPINESS SCALE:  Scale:  (0)= no chance of dozing; (1)= slight chance of dozing; (2)= moderate chance of dozing; (3)= high chance of dozing  Chance  Situtation    Sitting and reading: 0    Watching TV: 0    Sitting Inactive in public: 0    As a passenger in car: 1      Lying down to rest: 1    Sitting and talking: 0    Sitting quielty after  lunch: 0    In a car, stopped in traffic: 0   TOTAL SCORE:   2 out of 24    SLEEP STUDIES:  PSG 10/2011  - AHI 35 SpO56min 80%   CPAP COMPLIANCE DATA:  Date Range: 02/12/21 - 03/13/21  Average Daily Use: 9:14 hours  Median Use: 9:27 hours  Compliance for > 4 Hours: 100% days  AHI: 6.3 respiratory events per hour  Days Used: 30/30  Mask Leak: 37 lpm  95th Percentile Pressure: 12/8 cmH2O         LABS: No results found for this or any previous visit (from the past 2160 hour(s)).  Radiology: MM 3D SCREEN BREAST BILATERAL  Result Date: 11/08/2020 CLINICAL DATA:  Screening. EXAM: DIGITAL SCREENING BILATERAL MAMMOGRAM WITH TOMOSYNTHESIS AND CAD TECHNIQUE: Bilateral screening digital craniocaudal and mediolateral oblique mammograms were obtained. Bilateral screening digital breast tomosynthesis was performed. The images were evaluated with computer-aided detection. COMPARISON:  Previous exam(s). ACR Breast Density Category b: There are scattered areas of fibroglandular density. FINDINGS: There are no findings suspicious for malignancy. IMPRESSION: No mammographic evidence of malignancy. A result letter of this screening mammogram will be mailed directly to the patient. RECOMMENDATION: Screening mammogram in one year.  (Code:SM-B-01Y) BI-RADS CATEGORY  1: Negative. Electronically Signed   By: Lillia Mountain M.D.   On: 11/08/2020 15:07   No results found.  No results found.    Assessment and Plan: Patient Active Problem List   Diagnosis Date Noted   OSA treated with BiPAP 01/02/2021   Atrial fibrillation with RVR ([place]) 10/93/2355   Nonalcoholic steatohepatitis ([place]) 07/22/2020   Type 2 diabetes mellitus with stage 3 chronic kidney disease ([place]) 07/22/2020   Abnormal laboratory test result 04/07/2020   Chronic diastolic heart failure ([place]) 02/29/2020   Preoperative evaluation of a medical condition to rule out surgical contraindications (TAR required) 11/23/2019   Enthesopathy of left hip region 73/22/0254   Umbilical hernia without obstruction and without gangrene 06/24/2019   Liver transplant status ([place]) 06/09/2019   Trochanteric bursitis, left hip 06/09/2019   History of liver transplant ([place]) 06/09/2019   Left hip pain 03/31/2019   Glenohumeral arthritis 01/26/2019   S/P reverse total shoulder arthroplasty, right 01/26/2019   Anemia 12/31/2018   Bradycardia 12/31/2018   CKD (chronic kidney disease) stage 3, GFR 30-59 ml/min (HCC) 12/31/2018   Hyperlipidemia 12/31/2018   Insomnia 12/31/2018   Hyperlipidemia 12/31/2018   Rotator cuff tear arthropathy of right shoulder 12/22/2018   Sprain of rotator cuff capsule 12/22/2018   History of MI (myocardial infarction) 07/30/2017   Immunosuppressed status ([place]) 05/20/2017   PAF (paroxysmal atrial fibrillation) ([place]) 09/13/2016   Chronic anticoagulation 03/04/2016   Status post catheter ablation of atrial fibrillation 03/04/2016   Chronic bilateral low back pain with left-sided sciatica 01/04/2016   Chronic bilateral low back pain with left-sided sciatica 01/04/2016   Obstructive sleep apnea syndrome 10/19/2015   Lumbar stenosis 09/03/2012   Acquired hypothyroidism 12/28/2010   Coronary artery disease involving native coronary artery of native  heart without angina pectoris 12/28/2010   Depression 12/28/2010   Essential hypertension 12/28/2010   Hypertriglyceridemia without hypercholesterolemia 12/28/2010   Coronary artery disease involving native coronary artery of native heart without angina pectoris 12/28/2010   1. OSA treated with BiPAP The patient does tolerate PAP and reports  benefit from PAP use. The patient was reminded how to clean equipment and advised to replace supplies routinely. She needs a bipap asv titration to help assess her apnea  and determine optimal treatment. She has afib with RVR, no recent episodes which could be exacerbated by untreated apnea. . The patient was also counselled on weight loss. The compliance is excellent. The AHI is 6.3.   OSA with central apnea- uncontrolled, needs bipap asv titration.    2. PAF (paroxysmal atrial fibrillation) (HCC) Understands the importance of treating apnea in light of same.   3. Essential hypertension Hypertension Counseling:   The following hypertensive lifestyle modification were recommended and discussed:  1. Limiting alcohol intake to less than 1 oz/day of ethanol:(24 oz of beer or 8 oz of wine or 2 oz of 100-proof whiskey). 2. Take baby ASA 81 mg daily. 3. Importance of regular aerobic exercise and losing weight. 4. Reduce dietary saturated fat and cholesterol intake for overall cardiovascular health. 5. Maintaining adequate dietary potassium, calcium, and magnesium intake. 6. Regular monitoring of the blood pressure. 7. Reduce sodium intake to less than 100 mmol/day (less than 2.3 gm of sodium or less than 6 gm of sodium choride)      General Counseling: I have discussed the findings of the evaluation and examination with Constance Holster.  I have also discussed any further diagnostic evaluation thatmay be needed or ordered today. Kelilah verbalizes understanding of the findings of todays visit. We also reviewed her medications today and discussed drug interactions and  side effects including but not limited excessive drowsiness and altered mental states. We also discussed that there is always a risk not just to her but also people around her. she has been encouraged to call the office with any questions or concerns that should arise related to todays visit.  No orders of the defined types were placed in this encounter.       I have personally obtained a history, examined the patient, evaluated laboratory and imaging results, formulated the assessment and plan and placed orders. This patient was seen today by Tressie Ellis, PA-C in collaboration with Dr. Devona Konig.   Allyne Gee, MD [facility] Diplomate ABMS Pulmonary Critical Care Medicine and Sleep Medicine

## 2021-03-20 NOTE — Patient Instructions (Signed)

## 2021-09-29 ENCOUNTER — Other Ambulatory Visit: Payer: Self-pay | Admitting: Family Medicine

## 2021-09-29 DIAGNOSIS — Z1231 Encounter for screening mammogram for malignant neoplasm of breast: Secondary | ICD-10-CM

## 2021-11-13 ENCOUNTER — Ambulatory Visit: Payer: Medicare Other

## 2021-11-30 ENCOUNTER — Ambulatory Visit
Admission: RE | Admit: 2021-11-30 | Discharge: 2021-11-30 | Disposition: A | Discharge status: Home / Self Care | Payer: Medicare Other | Source: Ambulatory Visit | Attending: Family Medicine | Admitting: Family Medicine

## 2021-11-30 DIAGNOSIS — Z1231 Encounter for screening mammogram for malignant neoplasm of breast: Secondary | ICD-10-CM | POA: Insufficient documentation

## 2022-01-01 ENCOUNTER — Ambulatory Visit (INDEPENDENT_AMBULATORY_CARE_PROVIDER_SITE_OTHER): Payer: Medicare Other | Admitting: Internal Medicine

## 2022-01-01 VITALS — BP 105/60 | HR 71 | Resp 14 | Ht 64.0 in | Wt 139.0 lb

## 2022-01-01 DIAGNOSIS — Z7189 Other specified counseling: Secondary | ICD-10-CM | POA: Diagnosis not present

## 2022-01-01 DIAGNOSIS — G4731 Primary central sleep apnea: Secondary | ICD-10-CM | POA: Diagnosis not present

## 2022-01-01 DIAGNOSIS — I10 Essential (primary) hypertension: Secondary | ICD-10-CM | POA: Diagnosis not present

## 2022-01-01 DIAGNOSIS — I4891 Unspecified atrial fibrillation: Secondary | ICD-10-CM | POA: Diagnosis not present

## 2022-01-01 NOTE — Progress Notes (Unsigned)
[facility] [place], [place] [address]  Pulmonary Sleep Medicine   Office Visit Note  Patient Name: Sara Larson DOB: 1945/08/10 MRN [id number]    Chief Complaint: Obstructive Sleep Apnea visit  Brief History:  Sara Larson is seen today for an annual follow up on BIPAP at 12/8 cmh20.  The patient has a 10 year history of sleep apnea. Patient is using PAP nightly.  The patient feels rested after sleeping with PAP.  The patient reports benefiting from PAP use. Reported sleepiness is  improved and the Epworth Sleepiness Score is 2 out of 24. The patient does take naps, 2-3 times per week for about 1 hour. The patient complains of the following: No complaints, she tried a chinstrap to help with dry mouth but it did not make a difference.  The compliance download shows 99% compliance with an average use time of 8 hours 57 minutes. The AHI is 8.1.  The patient does not complain of limb movements disrupting sleep.  ROS  General: (-) fever, (-) chills, (-) night sweat Nose and Sinuses: (-) nasal stuffiness or itchiness, (-) postnasal drip, (-) nosebleeds, (-) sinus trouble. Mouth and Throat: (-) sore throat, (-) hoarseness. Neck: (-) swollen glands, (-) enlarged thyroid, (-) neck pain. Respiratory: - cough, - shortness of breath, - wheezing. Neurologic: - numbness, - tingling. Psychiatric: - anxiety, - depression   Current Medication: Outpatient Encounter Medications as of 01/01/2022  Medication Sig   acetaminophen (TYLENOL) 500 MG tablet Take by mouth.   amiodarone (PACERONE) 200 MG tablet Take by mouth.   amoxicillin (AMOXIL) 500 MG tablet DENTAL APPTS ONLY   apixaban (ELIQUIS) 5 MG TABS tablet Take 1 tablet by mouth every 12 (twelve) hours.   Artificial Tear Ointment (ULTRA FRESH PM) OINT Apply to eye.   aspirin 81 MG chewable tablet Chew by mouth.   atorvastatin (LIPITOR) 80 MG tablet Take by mouth.   atorvastatin (LIPITOR) 80 MG tablet Take 1 tablet by mouth  daily.   B Complex Vitamins (VITAMIN B-COMPLEX) TABS Take by mouth.   Blood Glucose Monitoring Suppl (GLUCOCOM BLOOD GLUCOSE MONITOR) DEVI 1 each by XX route as directed   calcium carbonate (TUMS EX) 750 MG chewable tablet Chew by mouth.   Calcium-Magnesium-Vitamin D (CITRACAL CALCIUM+D) 600-40-500 MG-MG-UNIT TB24 Take by mouth.   Cholecalciferol (VITAMIN D3) 25 MCG (1000 UT) CAPS Take by mouth.   clopidogrel (PLAVIX) 75 MG tablet Take by mouth.   Docusate Sodium (DSS) 100 MG CAPS Take by mouth.   ferrous sulfate 325 (65 FE) MG EC tablet Take by mouth.   furosemide (LASIX) 20 MG tablet Take by mouth.   glipiZIDE (GLUCOTROL XL) 2.5 MG 24 hr tablet Take by mouth.   glipiZIDE (GLUCOTROL XL) 2.5 MG 24 hr tablet Take 1 tablet by mouth daily.   hydrocortisone 2.5 % lotion Apply topically.   ipratropium (ATROVENT) 0.03 % nasal spray Place into the nose.   levothyroxine (SYNTHROID) 200 MCG tablet Take by mouth.   levothyroxine (SYNTHROID, LEVOTHROID) 137 MCG tablet Take by mouth.   magnesium oxide (MAG-OX) 400 MG tablet Take by mouth.   magnesium oxide (MAG-OX) 400 MG tablet Take by mouth.   metoprolol succinate (TOPROL-XL) 100 MG 24 hr tablet Take by mouth.   metoprolol succinate (TOPROL-XL) 100 MG 24 hr tablet Take by mouth.   nitroGLYCERIN (NITROSTAT) 0.4 MG SL tablet Place under the tongue.   nitroGLYCERIN (NITROSTAT) 0.4 MG SL tablet Place under the tongue.   pantoprazole (PROTONIX)  40 MG tablet Take by mouth.   pantoprazole (PROTONIX) 40 MG tablet Take by mouth.   polyethylene glycol powder (GLYCOLAX/MIRALAX) 17 GM/SCOOP powder Take 17 g by mouth daily as needed for moderate constipation.   Semaglutide,0.25 or 0.'5MG'$ /DOS, (OZEMPIC, 0.25 OR 0.5 MG/DOSE,) 2 MG/1.5ML SOPN Inject into the skin.   spironolactone (ALDACTONE) 25 MG tablet Take by mouth.   tacrolimus (PROGRAF) 0.5 MG capsule Take 0.5 mg (1 capsule) BID, alternating days with 0.'5mg'$  (1 capsule) am and 1.0 mg (2 capsules) the next day -  PO   tacrolimus (PROGRAF) 0.5 MG capsule Take by mouth.   traZODone (DESYREL) 50 MG tablet Take by mouth.   venlafaxine XR (EFFEXOR-XR) 75 MG 24 hr capsule Take by mouth.   [DISCONTINUED] Biotin 1000 MCG tablet Take by mouth.   [DISCONTINUED] candesartan (ATACAND) 16 MG tablet Take by mouth.   No facility-administered encounter medications on file as of 01/01/2022.    Surgical History: Past Surgical History:  Procedure Laterality Date   ABDOMINAL HYSTERECTOMY     ABLATION     BACK SURGERY     CORONARY ANGIOPLASTY WITH STENT PLACEMENT     HERNIA REPAIR     LIVER TRANSPLANT     REDUCTION MAMMAPLASTY  2003    Medical History: Past Medical History:  Diagnosis Date   Atrial fibrillation ([place])    Cancer ([place])    skin ca   Cancer ([place])    liver ca   Diabetes mellitus without complication ([place])    Hypertension    Myocardial infarct ([place])    Thyroid disease     Family History: Non contributory to the present illness  Social History: Social History   Socioeconomic History   Marital status: Widowed    Spouse name: Not on file   Number of children: Not on file   Years of education: Not on file   Highest education level: Not on file  Occupational History   Not on file  Tobacco Use   Smoking status: Never   Smokeless tobacco: Never  Substance and Sexual Activity   Alcohol use: Not Currently   Drug use: Never   Sexual activity: Not on file  Other Topics Concern   Not on file  Social History Narrative   Not on file   Social Determinants of Health   Financial Resource Strain: Not on file  Food Insecurity: Not on file  Transportation Needs: Not on file  Physical Activity: Not on file  Stress: Not on file  Social Connections: Not on file  Intimate Partner Violence: Not on file    Vital Signs: Blood pressure 105/60, pulse 71, resp. rate 14, height '5\' 4"'$  (1.626 m), weight 139 lb (63 kg), SpO2 98 %. Body mass index is 23.86 kg/m.    Examination: General  Appearance: The patient is well-developed, well-nourished, and in no distress. Neck Circumference: 38 cm Skin: Gross inspection of skin unremarkable. Head: normocephalic, no gross deformities. Eyes: no gross deformities noted. ENT: ears appear grossly normal Neurologic: Alert and oriented. No involuntary movements.  STOP BANG RISK ASSESSMENT S (snore) Have you been told that you snore?     NO   T (tired) Are you often tired, fatigued, or sleepy during the day?   NO  O (obstruction) Do you stop breathing, choke, or gasp during sleep? NO   P (pressure) Do you have or are you being treated for high blood pressure? NO   B (BMI) Is your body index greater than 35 kg/m?  NO   A (age) Are you 13 years old or older? YES   N (neck) Do you have a neck circumference greater than 16 inches?   NO   G (gender) Are you a female? NO   TOTAL STOP/BANG "YES" ANSWERS 1       A STOP-Bang score of 2 or less is considered low risk, and a score of 5 or more is high risk for having either moderate or severe OSA. For people who score 3 or 4, doctors may need to perform further assessment to determine how likely they are to have OSA.         EPWORTH SLEEPINESS SCALE:  Scale:  (0)= no chance of dozing; (1)= slight chance of dozing; (2)= moderate chance of dozing; (3)= high chance of dozing  Chance  Situtation    Sitting and reading: 0    Watching TV: 0    Sitting Inactive in public: 0    As a passenger in car: 0      Lying down to rest: 2    Sitting and talking: 0    Sitting quielty after lunch: 0    In a car, stopped in traffic: 0   TOTAL SCORE:   2 out of 24    SLEEP STUDIES:  PSG (11/15/11)  AHI 34.8, REM AHI 71.4, SPO2  70% Titration (11/22/11)  BIPAP titration Titration  (03/30/21)  BIPAP ASV at Min EPAP 5 cm, Max EPAP 15 cm, Min PS 3, Max PS 15, Max pressure 25 cm, backup rate auto    CPAP COMPLIANCE DATA:  Date Range: 12/28/20 - 12/27/21  Average Daily Use: 8 hours 57  minutes  Median Use: 9 hours 6 minutes  Compliance for > 4 Hours: 360 days  AHI: 8.1 respiratory events per hour  Days Used: 363/365  Mask Leak: 48  95th Percentile Pressure: 12/8 cmh20         LABS: No results found for this or any previous visit (from the past 2160 hour(s)).  Radiology: MM 3D SCREEN BREAST BILATERAL  Result Date: 12/01/2021 CLINICAL DATA:  Screening. EXAM: DIGITAL SCREENING BILATERAL MAMMOGRAM WITH TOMOSYNTHESIS AND CAD TECHNIQUE: Bilateral screening digital craniocaudal and mediolateral oblique mammograms were obtained. Bilateral screening digital breast tomosynthesis was performed. The images were evaluated with computer-aided detection. COMPARISON:  Previous exam(s). ACR Breast Density Category b: There are scattered areas of fibroglandular density. FINDINGS: There are no findings suspicious for malignancy. IMPRESSION: No mammographic evidence of malignancy. A result letter of this screening mammogram will be mailed directly to the patient. RECOMMENDATION: Screening mammogram in one year. (Code:SM-B-01Y) BI-RADS CATEGORY  1: Negative. Electronically Signed   By: Abelardo Diesel M.D.   On: 12/01/2021 10:23    No results found.  No results found.    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   Encounter for BiPAP use counseling 01/01/2022   Atrial fibrillation ([place]) 01/01/2022   OSA treated with BiPAP 01/02/2021   Atrial fibrillation with RVR ([place]) 41/96/2229   Nonalcoholic steatohepatitis ([facility]) 07/22/2020   Type 2 diabetes mellitus with stage 3 chronic kidney disease ([place]) 07/22/2020   Abnormal laboratory test result 04/07/2020   Chronic diastolic heart failure ([place]) 02/29/2020   Preoperative evaluation of a medical condition to rule out surgical contraindications (TAR required) 11/23/2019   Enthesopathy of left hip region 79/89/2119   Umbilical hernia without obstruction and  without gangrene 06/24/2019   Liver transplant  status ([place]) 06/09/2019   Trochanteric bursitis, left hip 06/09/2019   History of liver transplant ([place]) 06/09/2019   Left hip pain 03/31/2019   Glenohumeral arthritis 01/26/2019   S/P reverse total shoulder arthroplasty, right 01/26/2019   Anemia 12/31/2018   Bradycardia 12/31/2018   CKD (chronic kidney disease) stage 3, GFR 30-59 ml/min (HCC) 12/31/2018   Hyperlipidemia 12/31/2018   Insomnia 12/31/2018   Hyperlipidemia 12/31/2018   Rotator cuff tear arthropathy of right shoulder 12/22/2018   Sprain of rotator cuff capsule 12/22/2018   History of MI (myocardial infarction) 07/30/2017   Immunosuppressed status ([place]) 05/20/2017   PAF (paroxysmal atrial fibrillation) ([place]) 09/13/2016   Chronic anticoagulation 03/04/2016   Status post catheter ablation of atrial fibrillation 03/04/2016   Chronic bilateral low back pain with left-sided sciatica 01/04/2016   Chronic bilateral low back pain with left-sided sciatica 01/04/2016   Obstructive sleep apnea syndrome 10/19/2015   Lumbar stenosis 09/03/2012   Acquired hypothyroidism 12/28/2010   Coronary artery disease involving native coronary artery of native heart without angina pectoris 12/28/2010   Depression 12/28/2010   Essential hypertension 12/28/2010   Hypertriglyceridemia without hypercholesterolemia 12/28/2010   Coronary artery disease involving native coronary artery of native heart without angina pectoris 12/28/2010   1. Complex sleep apnea syndrome The patient does tolerate PAP and reports  benefit from PAP use. She has declined moving to bipap asv/st to improve control of her apnea. The patient was reminded how to clean equipment and advised to replace supplies routinely. The patient was also counselled on weight loss. The compliance is excellent. The AHI is 8.1 We had a lengthy discussion about next steps. She is willing to try a new mask and see if we can manage the leak better, and  if her numbers are still elevated, we will discuss the need to do a new titration (she has lost 50 lbs in the past year, the bulk of which have been since her last titration). She really does not want to change machines or to repeat titration, but understands it may be necessary.   Complex sleep apnea syndrome. Mask fitting. F/u 16m One month download.    2. Encounter for BiPAP use counseling CPAP Counseling: had a lengthy discussion with the patient regarding the importance of PAP therapy in management of the sleep apnea. Patient appears to understand the risk factor reduction and also understands the risks associated with untreated sleep apnea. Patient will try to make a good faith effort to remain compliant with therapy. Also instructed the patient on proper cleaning of the device including the water must be changed daily if possible and use of distilled water is preferred. Patient understands that the machine should be regularly cleaned with appropriate recommended cleaning solutions that do not damage the PAP machine for example given white vinegar and water rinses. Other methods such as ozone treatment may not be as good as these simple methods to achieve cleaning.   3. Essential hypertension Hypertension Counseling:   The following hypertensive lifestyle modification were recommended and discussed:  1. Limiting alcohol intake to less than 1 oz/day of ethanol:(24 oz of beer or 8 oz of wine or 2 oz of 100-proof whiskey). 2. Take baby ASA 81 mg daily. 3. Importance of regular aerobic exercise and losing weight. 4. Reduce dietary saturated fat and cholesterol intake for overall cardiovascular health. 5. Maintaining adequate dietary potassium, calcium, and magnesium intake. 6. Regular monitoring of the blood pressure. 7. Reduce sodium intake to less than 100 mmol/day (less  than 2.3 gm of sodium or less than 6 gm of sodium choride)    4. Atrial fibrillation, unspecified type ([place]) Stable with  controlled ventricular response.     General Counseling: I have discussed the findings of the evaluation and examination with [facility].  I have also discussed any further diagnostic evaluation thatmay be needed or ordered today. Sara Larson verbalizes understanding of the findings of todays visit. We also reviewed her medications today and discussed drug interactions and side effects including but not limited excessive drowsiness and altered mental states. We also discussed that there is always a risk not just to her but also people around her. she has been encouraged to call the office with any questions or concerns that should arise related to todays visit.  No orders of the defined types were placed in this encounter.       I have personally obtained a history, examined the patient, evaluated laboratory and imaging results, formulated the assessment and plan and placed orders. This patient was seen today by Tressie Ellis, PA-C in collaboration with Dr. Devona Konig.   Allyne Gee, MD [facility] Diplomate ABMS Pulmonary Critical Care Medicine and Sleep Medicine

## 2022-01-01 NOTE — Patient Instructions (Signed)

## 2022-03-26 ENCOUNTER — Ambulatory Visit (INDEPENDENT_AMBULATORY_CARE_PROVIDER_SITE_OTHER): Payer: Medicare Other | Admitting: Internal Medicine

## 2022-03-26 VITALS — BP 111/62 | HR 56 | Resp 16 | Ht 64.0 in | Wt 139.0 lb

## 2022-03-26 DIAGNOSIS — I4891 Unspecified atrial fibrillation: Secondary | ICD-10-CM

## 2022-03-26 DIAGNOSIS — G4739 Other sleep apnea: Secondary | ICD-10-CM

## 2022-03-26 DIAGNOSIS — G4731 Primary central sleep apnea: Secondary | ICD-10-CM | POA: Diagnosis not present

## 2022-03-26 DIAGNOSIS — Z7189 Other specified counseling: Secondary | ICD-10-CM

## 2022-03-26 DIAGNOSIS — I10 Essential (primary) hypertension: Secondary | ICD-10-CM

## 2022-03-26 NOTE — Progress Notes (Signed)
[facility] [place], [place] [address]  Pulmonary Sleep Medicine   Office Visit Note  Patient Name: Sara Larson DOB: [birth year] MRN [id number]    Chief Complaint: Obstructive Sleep Apnea visit  Brief History:  [name] is seen today for a 2 month follow up on BIPAP at 10/6 cmh20. The patient has a 10 year history of sleep apnea. Patient is using PAP nightly.  The patient feels rested after sleeping with PAP.  The patient reports benefiting from PAP use. Reported sleepiness is  improved and the Epworth Sleepiness Score is 3 out of 24. The patient occasionally take naps, 3 times per week for about an hour. The patient complains of the following: She had lots of issues with the full face mask leaking and comfort.  She switched back to her nasal pillows mask and is doing much better.  The compliance download shows 87% compliance with an average use time of 7 hours 4 minutes. The AHI is 10.2.  The patient does not complain of limb movements disrupting sleep.  ROS  General: (-) fever, (-) chills, (-) night sweat Nose and Sinuses: (-) nasal stuffiness or itchiness, (-) postnasal drip, (-) nosebleeds, (-) sinus trouble. Mouth and Throat: (-) sore throat, (-) hoarseness. Neck: (-) swollen glands, (-) enlarged thyroid, (-) neck pain. Respiratory: - cough, - shortness of breath, - wheezing. Neurologic: - numbness, - tingling. Psychiatric: - anxiety, - depression   Current Medication: Outpatient Encounter Medications as of 03/26/2022  Medication Sig   acetaminophen (TYLENOL) 500 MG tablet Take by mouth.   amiodarone (PACERONE) 200 MG tablet Take by mouth.   apixaban (ELIQUIS) 5 MG TABS tablet Take 1 tablet by mouth every 12 (twelve) hours.   Artificial Tear Ointment (ULTRA FRESH PM) OINT Apply to eye.   aspirin 81 MG chewable tablet Chew by mouth.   atorvastatin (LIPITOR) 80 MG tablet Take 1 tablet by mouth daily.   B Complex Vitamins (VITAMIN B-COMPLEX) TABS Take  by mouth.   Blood Glucose Monitoring Suppl (GLUCOCOM BLOOD GLUCOSE MONITOR) DEVI 1 each by XX route as directed   calcium carbonate (TUMS EX) 750 MG chewable tablet Chew by mouth.   Cholecalciferol (VITAMIN D3) 25 MCG (1000 UT) CAPS Take by mouth.   clopidogrel (PLAVIX) 75 MG tablet Take by mouth.   Docusate Sodium (DSS) 100 MG CAPS Take by mouth.   ferrous sulfate 325 (65 FE) MG EC tablet Take by mouth.   furosemide (LASIX) 20 MG tablet Take by mouth.   glipiZIDE (GLUCOTROL XL) 2.5 MG 24 hr tablet Take 1 tablet by mouth daily.   ipratropium (ATROVENT) 0.03 % nasal spray Place into the nose.   magnesium oxide (MAG-OX) 400 MG tablet Take by mouth.   metoprolol succinate (TOPROL-XL) 100 MG 24 hr tablet Take by mouth.   nitroGLYCERIN (NITROSTAT) 0.4 MG SL tablet Place under the tongue.   pantoprazole (PROTONIX) 40 MG tablet Take by mouth.   polyethylene glycol powder (GLYCOLAX/MIRALAX) 17 GM/SCOOP powder Take 17 g by mouth daily as needed for moderate constipation.   spironolactone (ALDACTONE) 25 MG tablet Take by mouth.   tacrolimus (PROGRAF) 0.5 MG capsule Take by mouth.   [DISCONTINUED] amoxicillin (AMOXIL) 500 MG tablet DENTAL APPTS ONLY   [DISCONTINUED] atorvastatin (LIPITOR) 80 MG tablet Take by mouth.   [DISCONTINUED] Calcium-Magnesium-Vitamin D (CITRACAL CALCIUM+D) [id number] MG-MG-UNIT TB24 Take by mouth.   [DISCONTINUED] glipiZIDE (GLUCOTROL XL) 2.5 MG 24 hr tablet Take by mouth.   [DISCONTINUED] hydrocortisone 2.5 %  lotion Apply topically.   [DISCONTINUED] levothyroxine (SYNTHROID) 200 MCG tablet Take by mouth.   [DISCONTINUED] levothyroxine (SYNTHROID, LEVOTHROID) 137 MCG tablet Take by mouth.   [DISCONTINUED] magnesium oxide (MAG-OX) 400 MG tablet Take by mouth.   [DISCONTINUED] metoprolol succinate (TOPROL-XL) 100 MG 24 hr tablet Take by mouth.   [DISCONTINUED] nitroGLYCERIN (NITROSTAT) 0.4 MG SL tablet Place under the tongue.   [DISCONTINUED] pantoprazole (PROTONIX) 40 MG  tablet Take by mouth.   [DISCONTINUED] Semaglutide,0.25 or 0.'5MG'$ /DOS, (OZEMPIC, 0.25 OR 0.5 MG/DOSE,) 2 MG/1.5ML SOPN Inject into the skin.   [DISCONTINUED] tacrolimus (PROGRAF) 0.5 MG capsule Take 0.5 mg (1 capsule) BID, alternating days with 0.'5mg'$  (1 capsule) am and 1.0 mg (2 capsules) the next day - PO   [DISCONTINUED] traZODone (DESYREL) 50 MG tablet Take by mouth.   [DISCONTINUED] venlafaxine XR (EFFEXOR-XR) 75 MG 24 hr capsule Take by mouth.   No facility-administered encounter medications on file as of 03/26/2022.    Surgical History: Past Surgical History:  Procedure Laterality Date   ABDOMINAL HYSTERECTOMY     ABLATION     BACK SURGERY     CORONARY ANGIOPLASTY WITH STENT PLACEMENT     HERNIA REPAIR     LIVER TRANSPLANT     REDUCTION MAMMAPLASTY  2003    Medical History: Past Medical History:  Diagnosis Date   Atrial fibrillation ([place])    Cancer ([place])    skin ca   Cancer ([place])    liver ca   Diabetes mellitus without complication ([place])    Hypertension    Myocardial infarct ([place])    Thyroid disease     Family History: Non contributory to the present illness  Social History: Social History   Socioeconomic History   Marital status: Widowed    Spouse name: Not on file   Number of children: Not on file   Years of education: Not on file   Highest education level: Not on file  Occupational History   Not on file  Tobacco Use   Smoking status: Never   Smokeless tobacco: Never  Substance and Sexual Activity   Alcohol use: Not Currently   Drug use: Never   Sexual activity: Not on file  Other Topics Concern   Not on file  Social History Narrative   Not on file   Social Determinants of Health   Financial Resource Strain: Not on file  Food Insecurity: Not on file  Transportation Needs: Not on file  Physical Activity: Not on file  Stress: Not on file  Social Connections: Not on file  Intimate Partner Violence: Not on file    Vital Signs: Blood pressure  111/62, pulse (!) 56, resp. rate 16, height '5\' 4"'$  (1.626 m), weight 139 lb (63 kg), SpO2 100 %. Body mass index is 23.86 kg/m.    Examination: General Appearance: The patient is well-developed, well-nourished, and in no distress. Neck Circumference: 38 cm Skin: Gross inspection of skin unremarkable. Head: normocephalic, no gross deformities. Eyes: no gross deformities noted. ENT: ears appear grossly normal Neurologic: Alert and oriented. No involuntary movements.  STOP BANG RISK ASSESSMENT S (snore) Have you been told that you snore?     NO   T (tired) Are you often tired, fatigued, or sleepy during the day?   NO  O (obstruction) Do you stop breathing, choke, or gasp during sleep? NO   P (pressure) Do you have or are you being treated for high blood pressure? YES   B (BMI) Is your body index  greater than 35 kg/m? NO   A (age) Are you 78 years old or older? YES   N (neck) Do you have a neck circumference greater than 16 inches?   NO   G (gender) Are you a female? NO   TOTAL STOP/BANG "YES" ANSWERS 2       A STOP-Bang score of 2 or less is considered low risk, and a score of 5 or more is high risk for having either moderate or severe OSA. For people who score 3 or 4, doctors may need to perform further assessment to determine how likely they are to have OSA.         EPWORTH SLEEPINESS SCALE:  Scale:  (0)= no chance of dozing; (1)= slight chance of dozing; (2)= moderate chance of dozing; (3)= high chance of dozing  Chance  Situtation    Sitting and reading: 0    Watching TV: 0    Sitting Inactive in public: 1    As a passenger in car: 1      Lying down to rest: 1    Sitting and talking: 0    Sitting quielty after lunch: 0    In a car, stopped in traffic: 0   TOTAL SCORE:   3 out of 24    SLEEP STUDIES:  PSG (11/15/11)  AHI 34.8, REM AHI 71.4, SPO2  70% Titration (11/22/11)  BIPAP titration Titration (11/29/11)  BIPAP 16/12 cmh20   CPAP COMPLIANCE  DATA:  Date Range: 01/21/22 - 03/21/22  Average Daily Use: 7 hours 4 minutes  Median Use: 7 hours 35 minutes  Compliance for > 4 Hours: 52 days  AHI: 10.2 respiratory events per hour  Days Used: 58/60  Mask Leak: 24.7  95th Percentile Pressure: 10/6 cmh20         LABS: No results found for this or any previous visit (from the past 2160 hour(s)).  Radiology: MM 3D SCREEN BREAST BILATERAL  Result Date: 12/01/2021 CLINICAL DATA:  Screening. EXAM: DIGITAL SCREENING BILATERAL MAMMOGRAM WITH TOMOSYNTHESIS AND CAD TECHNIQUE: Bilateral screening digital craniocaudal and mediolateral oblique mammograms were obtained. Bilateral screening digital breast tomosynthesis was performed. The images were evaluated with computer-aided detection. COMPARISON:  Previous exam(s). ACR Breast Density Category b: There are scattered areas of fibroglandular density. FINDINGS: There are no findings suspicious for malignancy. IMPRESSION: No mammographic evidence of malignancy. A result letter of this screening mammogram will be mailed directly to the patient. RECOMMENDATION: Screening mammogram in one year. (Code:SM-B-01Y) BI-RADS CATEGORY  1: Negative. Electronically Signed   By: Abelardo Diesel M.D.   On: 12/01/2021 10:23    No results found.  No results found.    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   Encounter for BiPAP use counseling 01/01/2022   Atrial fibrillation ([place]) 01/01/2022   OSA treated with BiPAP 01/02/2021   Atrial fibrillation with RVR ([place]) 49/70/2637   Nonalcoholic steatohepatitis ([facility]) 07/22/2020   Type 2 diabetes mellitus with stage 3 chronic kidney disease ([place]) 07/22/2020   Abnormal laboratory test result 04/07/2020   Chronic diastolic heart failure ([place]) 02/29/2020   Preoperative evaluation of a medical condition to rule out surgical contraindications (TAR required) 11/23/2019   Enthesopathy  of left hip region 85/88/5027   Umbilical hernia without obstruction and without gangrene 06/24/2019   Liver transplant status ([place]) 06/09/2019   Trochanteric bursitis, left hip 06/09/2019   History of liver transplant ([place]) 06/09/2019  Left hip pain 03/31/2019   Glenohumeral arthritis 01/26/2019   S/P reverse total shoulder arthroplasty, right 01/26/2019   Anemia 12/31/2018   Bradycardia 12/31/2018   CKD (chronic kidney disease) stage 3, GFR 30-59 ml/min (HCC) 12/31/2018   Hyperlipidemia 12/31/2018   Insomnia 12/31/2018   Hyperlipidemia 12/31/2018   Rotator cuff tear arthropathy of right shoulder 12/22/2018   Sprain of rotator cuff capsule 12/22/2018   History of MI (myocardial infarction) 07/30/2017   Immunosuppressed status ([place]) 05/20/2017   PAF (paroxysmal atrial fibrillation) ([place]) 09/13/2016   Chronic anticoagulation 03/04/2016   Status post catheter ablation of atrial fibrillation 03/04/2016   Chronic bilateral low back pain with left-sided sciatica 01/04/2016   Chronic bilateral low back pain with left-sided sciatica 01/04/2016   Obstructive sleep apnea syndrome 10/19/2015   Lumbar stenosis 09/03/2012   Acquired hypothyroidism 12/28/2010   Coronary artery disease involving native coronary artery of native heart without angina pectoris 12/28/2010   Depression 12/28/2010   Essential hypertension 12/28/2010   Hypertriglyceridemia without hypercholesterolemia 12/28/2010   Coronary artery disease involving native coronary artery of native heart without angina pectoris 12/28/2010    1. Complex sleep apnea syndrome The patient has complex apnea that has not been controlled on bipap despite multiple efforts of changing pressures, mask styles (she does have some leak, but does not tolerate full face mask or chin strap) Current AHI is 10.2  She had a bipap asv titration 03/30/2021 which showed significant improvement with asv.  She needs a bipap titration to establish that her  apnea cannot be controlled with bipap alone, and that she needs the assist of the asv.  Patient is agreeable to proceed.  F/u 30d after set up  Addendum: after further investigation it was determined pt does not require titration. Will f/u after set up with bipap asv.   2. Encounter for BiPAP use counseling  Counseling: had a lengthy discussion with the patient regarding the importance of PAP therapy in management of the sleep apnea. Patient appears to understand the risk factor reduction and also understands the risks associated with untreated sleep apnea. Patient will try to make a good faith effort to remain compliant with therapy. Also instructed the patient on proper cleaning of the device including the water must be changed daily if possible and use of distilled water is preferred. Patient understands that the machine should be regularly cleaned with appropriate recommended cleaning solutions that do not damage the PAP machine for example given white vinegar and water rinses. Other methods such as ozone treatment may not be as good as these simple methods to achieve cleaning.   3. Essential hypertension Hypertension Counseling:   The following hypertensive lifestyle modification were recommended and discussed:  1. Limiting alcohol intake to less than 1 oz/day of ethanol:(24 oz of beer or 8 oz of wine or 2 oz of 100-proof whiskey). 2. Take baby ASA 81 mg daily. 3. Importance of regular aerobic exercise and losing weight. 4. Reduce dietary saturated fat and cholesterol intake for overall cardiovascular health. 5. Maintaining adequate dietary potassium, calcium, and magnesium intake. 6. Regular monitoring of the blood pressure. 7. Reduce sodium intake to less than 100 mmol/day (less than 2.3 gm of sodium or less than 6 gm of sodium choride)    4. Atrial fibrillation, unspecified type ([place]) Continues to be an ongoing issue- she had cardioversion last week.  Understands relationship to OSA.      General Counseling: I have discussed the findings of the evaluation and examination  with Constance Holster.  I have also discussed any further diagnostic evaluation thatmay be needed or ordered today. Myrtice verbalizes understanding of the findings of todays visit. We also reviewed her medications today and discussed drug interactions and side effects including but not limited excessive drowsiness and altered mental states. We also discussed that there is always a risk not just to her but also people around her. she has been encouraged to call the office with any questions or concerns that should arise related to todays visit.  No orders of the defined types were placed in this encounter.       I have personally obtained a history, examined the patient, evaluated laboratory and imaging results, formulated the assessment and plan and placed orders. This patient was seen today by Tressie Ellis, PA-C in collaboration with Dr. Devona Konig.   Allyne Gee, MD [facility] Diplomate ABMS Pulmonary Critical Care Medicine and Sleep Medicine

## 2022-03-26 NOTE — Patient Instructions (Signed)

## 2022-08-13 ENCOUNTER — Ambulatory Visit (INDEPENDENT_AMBULATORY_CARE_PROVIDER_SITE_OTHER): Payer: Medicare Other | Admitting: Internal Medicine

## 2022-08-13 VITALS — BP 123/70 | HR 57 | Resp 14 | Ht 64.0 in | Wt 140.0 lb

## 2022-08-13 DIAGNOSIS — Z7189 Other specified counseling: Secondary | ICD-10-CM | POA: Diagnosis not present

## 2022-08-13 DIAGNOSIS — G4731 Primary central sleep apnea: Secondary | ICD-10-CM

## 2022-08-13 DIAGNOSIS — I10 Essential (primary) hypertension: Secondary | ICD-10-CM | POA: Diagnosis not present

## 2022-08-13 NOTE — Progress Notes (Signed)
[facility] [address] [place], [place] [address]  Pulmonary Sleep Medicine   Office Visit Note  Patient Name: JOANY KHATIB DOB: May 23, 1945 MRN [id number]    Chief Complaint: Obstructive Sleep Apnea visit  Brief History:  Natisha is seen today for follow up 3 months after setup on BIPAP ASV.  The patient has a 10 year history of sleep apnea. Patient is using PAP nightly.  The patient feels rested after sleeping with PAP.  The patient reports benefiting from PAP use. Reported sleepiness is  improved and the Epworth Sleepiness Score is 2 out of 24. The patient occasionally take naps. The patient complains of the following: Some issue with the water chamber running out of water, we discussed adjusting the humidity level.   The compliance download shows 100% compliance with an average use time of 8:56 hours. The AHI is 10.3.  The patient does not complain of limb movements disrupting sleep. The patient reports that she has lost 90 lbs in the last year.   ROS  General: (-) fever, (-) chills, (-) night sweat Nose and Sinuses: (-) nasal stuffiness or itchiness, (-) postnasal drip, (-) nosebleeds, (-) sinus trouble. Mouth and Throat: (-) sore throat, (-) hoarseness. Neck: (-) swollen glands, (-) enlarged thyroid, (-) neck pain. Respiratory: - cough, - shortness of breath, - wheezing. Neurologic: - numbness, - tingling. Psychiatric: - anxiety, - depression   Current Medication: Outpatient Encounter Medications as of 08/13/2022  Medication Sig   acetaminophen (TYLENOL) 500 MG tablet Take by mouth.   amiodarone (PACERONE) 200 MG tablet Take by mouth.   apixaban (ELIQUIS) 5 MG TABS tablet Take 1 tablet by mouth every 12 (twelve) hours.   Artificial Tear Ointment (ULTRA FRESH PM) OINT Apply to eye.   aspirin 81 MG chewable tablet Chew by mouth.   atorvastatin (LIPITOR) 80 MG tablet Take 1 tablet by mouth daily.   B Complex Vitamins (VITAMIN B-COMPLEX) TABS Take by mouth.    Blood Glucose Monitoring Suppl (GLUCOCOM BLOOD GLUCOSE MONITOR) DEVI 1 each by XX route as directed   calcium carbonate (TUMS EX) 750 MG chewable tablet Chew by mouth.   Cholecalciferol (VITAMIN D3) 25 MCG (1000 UT) CAPS Take by mouth.   clopidogrel (PLAVIX) 75 MG tablet Take by mouth.   Docusate Sodium (DSS) 100 MG CAPS Take by mouth.   ferrous sulfate 325 (65 FE) MG EC tablet Take by mouth.   furosemide (LASIX) 20 MG tablet Take by mouth.   glipiZIDE (GLUCOTROL XL) 2.5 MG 24 hr tablet Take 1 tablet by mouth daily.   ipratropium (ATROVENT) 0.03 % nasal spray Place into the nose.   magnesium oxide (MAG-OX) 400 MG tablet Take by mouth.   metoprolol succinate (TOPROL-XL) 100 MG 24 hr tablet Take by mouth.   nitroGLYCERIN (NITROSTAT) 0.4 MG SL tablet Place under the tongue.   pantoprazole (PROTONIX) 40 MG tablet Take by mouth.   polyethylene glycol powder (GLYCOLAX/MIRALAX) 17 GM/SCOOP powder Take 17 g by mouth daily as needed for moderate constipation.   spironolactone (ALDACTONE) 25 MG tablet Take by mouth.   tacrolimus (PROGRAF) 0.5 MG capsule Take by mouth.   No facility-administered encounter medications on file as of 08/13/2022.    Surgical History: Past Surgical History:  Procedure Laterality Date   ABDOMINAL HYSTERECTOMY     ABLATION     BACK SURGERY     CORONARY ANGIOPLASTY WITH STENT PLACEMENT     HERNIA REPAIR     LIVER TRANSPLANT  REDUCTION MAMMAPLASTY  2003    Medical History: Past Medical History:  Diagnosis Date   Atrial fibrillation (HCC)    Cancer (HCC)    skin ca   Cancer (HCC)    liver ca   Diabetes mellitus without complication (HCC)    Hypertension    Myocardial infarct (HCC)    Thyroid disease     Family History: Non contributory to the present illness  Social History: Social History   Socioeconomic History   Marital status: Widowed    Spouse name: Not on file   Number of children: Not on file   Years of education: Not on file   Highest  education level: Not on file  Occupational History   Not on file  Tobacco Use   Smoking status: Never   Smokeless tobacco: Never  Substance and Sexual Activity   Alcohol use: Not Currently   Drug use: Never   Sexual activity: Not on file  Other Topics Concern   Not on file  Social History Narrative   Not on file   Social Determinants of Health   Financial Resource Strain: Not on file  Food Insecurity: Not on file  Transportation Needs: Not on file  Physical Activity: Not on file  Stress: Not on file  Social Connections: Not on file  Intimate Partner Violence: Not on file    Vital Signs: There were no vitals taken for this visit. There is no height or weight on file to calculate BMI.    Examination: General Appearance: The patient is well-developed, well-nourished, and in no distress. Neck Circumference: 38 cm Skin: Gross inspection of skin unremarkable. Head: normocephalic, no gross deformities. Eyes: no gross deformities noted. ENT: ears appear grossly normal Neurologic: Alert and oriented. No involuntary movements.  STOP BANG RISK ASSESSMENT S (snore) Have you been told that you snore?     YES   T (tired) Are you often tired, fatigued, or sleepy during the day?   NO  O (obstruction) Do you stop breathing, choke, or gasp during sleep? NO   P (pressure) Do you have or are you being treated for high blood pressure? YES   B (BMI) Is your body index greater than 35 kg/m? NO   A (age) Are you 78 years old or older? YES   N (neck) Do you have a neck circumference greater than 16 inches?   NO   G (gender) Are you a female? NO   TOTAL STOP/BANG "YES" ANSWERS 3       A STOP-Bang score of 2 or less is considered low risk, and a score of 5 or more is high risk for having either moderate or severe OSA. For people who score 3 or 4, doctors may need to perform further assessment to determine how likely they are to have OSA.         EPWORTH SLEEPINESS SCALE:  Scale:   (0)= no chance of dozing; (1)= slight chance of dozing; (2)= moderate chance of dozing; (3)= high chance of dozing  Chance  Situtation    Sitting and reading: 0    Watching TV: 0    Sitting Inactive in public: 0    As a passenger in car: 1      Lying down to rest: 1    Sitting and talking: 0    Sitting quielty after lunch: 0    In a car, stopped in traffic: 0   TOTAL SCORE:   2 out of 24  SLEEP STUDIES:  PSG (11/15/11)  AHI 34.8, REM AHI 71.4, min SPO2 70% TITRATION (11/22/11) BIPAP titration TITRATION (11/29/11) BIPAP at 16/12 cmh20 TITRATION (03/30/21) BIPAP ASV   CPAP COMPLIANCE DATA:  Date Range: 05/04/22 - 06/02/22  Average Daily Use: 8:56 hours  Median Use: 9 hours  Compliance for > 4 Hours: 30 days  AHI: 10.3 respiratory events per hour  Days Used: 30/30  Mask Leak: 65  95th Percentile Pressure: ASV         LABS: No results found for this or any previous visit (from the past 2160 hour(s)).  Radiology: MM 3D SCREEN BREAST BILATERAL  Result Date: 12/01/2021 CLINICAL DATA:  Screening. EXAM: DIGITAL SCREENING BILATERAL MAMMOGRAM WITH TOMOSYNTHESIS AND CAD TECHNIQUE: Bilateral screening digital craniocaudal and mediolateral oblique mammograms were obtained. Bilateral screening digital breast tomosynthesis was performed. The images were evaluated with computer-aided detection. COMPARISON:  Previous exam(s). ACR Breast Density Category b: There are scattered areas of fibroglandular density. FINDINGS: There are no findings suspicious for malignancy. IMPRESSION: No mammographic evidence of malignancy. A result letter of this screening mammogram will be mailed directly to the patient. RECOMMENDATION: Screening mammogram in one year. (Code:SM-B-01Y) BI-RADS CATEGORY  1: Negative. Electronically Signed   By: Sherian Rein M.D.   On: 12/01/2021 10:23    No results found.  No results found.    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   Encounter for BiPAP use counseling 01/01/2022   Atrial fibrillation 01/01/2022   OSA treated with BiPAP 01/02/2021   Atrial fibrillation with RVR 12/23/2020   Nonalcoholic steatohepatitis (NASH) 07/22/2020   Type 2 diabetes mellitus with stage 3 chronic kidney disease 07/22/2020   Abnormal laboratory test result 04/07/2020   Chronic diastolic heart failure 02/29/2020   Preoperative evaluation of a medical condition to rule out surgical contraindications (TAR required) 11/23/2019   Enthesopathy of left hip region 07/02/2019   Umbilical hernia without obstruction and without gangrene 06/24/2019   Liver transplant status 06/09/2019   Trochanteric bursitis, left hip 06/09/2019   History of liver transplant 06/09/2019   Left hip pain 03/31/2019   Glenohumeral arthritis 01/26/2019   S/P reverse total shoulder arthroplasty, right 01/26/2019   Anemia 12/31/2018   Bradycardia 12/31/2018   CKD (chronic kidney disease) stage 3, GFR 30-59 ml/min 12/31/2018   Hyperlipidemia 12/31/2018   Insomnia 12/31/2018   Hyperlipidemia 12/31/2018   Rotator cuff tear arthropathy of right shoulder 12/22/2018   Sprain of rotator cuff capsule 12/22/2018   History of MI (myocardial infarction) 07/30/2017   Immunosuppressed status 05/20/2017   PAF (paroxysmal atrial fibrillation) 09/13/2016   Chronic anticoagulation 03/04/2016   Status post catheter ablation of atrial fibrillation 03/04/2016   Chronic bilateral low back pain with left-sided sciatica 01/04/2016   Chronic bilateral low back pain with left-sided sciatica 01/04/2016   Obstructive sleep apnea syndrome 10/19/2015   Lumbar stenosis 09/03/2012   Acquired hypothyroidism 12/28/2010   Coronary artery disease involving native coronary artery of native heart without angina pectoris 12/28/2010   Depression 12/28/2010   Essential hypertension 12/28/2010   Hypertriglyceridemia without  hypercholesterolemia 12/28/2010   Coronary artery disease involving native coronary artery of native heart without angina pectoris 12/28/2010   1. Complex sleep apnea syndrome The patient does tolerate PAP and reports  benefit from PAP use. She reports significant weight loss. She reports that she is feeling the best she has in many years. Her apnea is not  controlled. The patient was reminded how to clean equipment and advised to replace supplies routinely.The compliance is excellent. The AHI is 10.3.   Complex apnea- not controlled. Pt has lost significant weight. Will get sleep study to make sure she still needs treatment. We will adjust pressure back to min EPAP 5 max EPAP 15, min PS 3, max PS 15  2. Encounter for BiPAP use counseling  Counseling: had a lengthy discussion with the patient regarding the importance of PAP therapy in management of the sleep apnea. Patient appears to understand the risk factor reduction and also understands the risks associated with untreated sleep apnea. Patient will try to make a good faith effort to remain compliant with therapy. Also instructed the patient on proper cleaning of the device including the water must be changed daily if possible and use of distilled water is preferred. Patient understands that the machine should be regularly cleaned with appropriate recommended cleaning solutions that do not damage the PAP machine for example given white vinegar and water rinses. Other methods such as ozone treatment may not be as good as these simple methods to achieve cleaning.   3. Essential hypertension Hypertension Counseling:   The following hypertensive lifestyle modification were recommended and discussed:  1. Limiting alcohol intake to less than 1 oz/day of ethanol:(24 oz of beer or 8 oz of wine or 2 oz of 100-proof whiskey). 2. Take baby ASA 81 mg daily. 3. Importance of regular aerobic exercise and losing weight. 4. Reduce dietary saturated fat and  cholesterol intake for overall cardiovascular health. 5. Maintaining adequate dietary potassium, calcium, and magnesium intake. 6. Regular monitoring of the blood pressure. 7. Reduce sodium intake to less than 100 mmol/day (less than 2.3 gm of sodium or less than 6 gm of sodium choride)      General Counseling: I have discussed the findings of the evaluation and examination with Nelva Bush.  I have also discussed any further diagnostic evaluation thatmay be needed or ordered today. Stefany verbalizes understanding of the findings of todays visit. We also reviewed her medications today and discussed drug interactions and side effects including but not limited excessive drowsiness and altered mental states. We also discussed that there is always a risk not just to her but also people around her. she has been encouraged to call the office with any questions or concerns that should arise related to todays visit.  No orders of the defined types were placed in this encounter.       I have personally obtained a history, examined the patient, evaluated laboratory and imaging results, formulated the assessment and plan and placed orders. This patient was seen today by Emmaline Kluver, PA-C in collaboration with Dr. Freda Munro.   Yevonne Pax, MD [facility] Diplomate ABMS Pulmonary Critical Care Medicine and Sleep Medicine

## 2022-08-13 NOTE — Patient Instructions (Signed)

## 2022-10-15 ENCOUNTER — Other Ambulatory Visit: Payer: Self-pay | Admitting: Physician Assistant

## 2022-10-15 DIAGNOSIS — Z1231 Encounter for screening mammogram for malignant neoplasm of breast: Secondary | ICD-10-CM

## 2022-12-04 ENCOUNTER — Ambulatory Visit: Payer: Medicare Other

## 2022-12-10 ENCOUNTER — Ambulatory Visit
Admission: RE | Admit: 2022-12-10 | Discharge: 2022-12-10 | Disposition: A | Discharge status: Home / Self Care | Payer: Medicare Other | Source: Ambulatory Visit | Attending: Physician Assistant | Admitting: Physician Assistant

## 2022-12-10 DIAGNOSIS — Z1231 Encounter for screening mammogram for malignant neoplasm of breast: Secondary | ICD-10-CM | POA: Diagnosis present

## 2023-02-15 NOTE — Progress Notes (Unsigned)
[facility] [address] [place], [place] [address]  Pulmonary Sleep Medicine   Office Visit Note  Patient Name: Sara Larson DOB: 04-05-1946 MRN [id number]    Chief Complaint: Obstructive Sleep Apnea visit  Brief History:  Sara Larson is seen today for a follow up visit for CPAP@ 8 cmH2O. The patient has a 10 year history of sleep apnea. Patient is using PAP nightly.  The patient feels rested after sleeping with PAP.  The patient reports benefiting from PAP use. Reported sleepiness is  improved and the Epworth Sleepiness Score is 1 out of 24. The patient sometimes takes naps. The patient complains of the following: No complaints.  The compliance download shows 98% compliance with an average use time of 8 hours 49 minutes. The AHI is 3.8.  The patient does not complain of limb movements disrupting sleep. The patient continues to require PAP therapy in order to eliminate sleep apnea.  ROS  General: (-) fever, (-) chills, (-) night sweat Nose and Sinuses: (-) nasal stuffiness or itchiness, (-) postnasal drip, (-) nosebleeds, (-) sinus trouble. Mouth and Throat: (-) sore throat, (-) hoarseness. Neck: (-) swollen glands, (-) enlarged thyroid, (-) neck pain. Respiratory: - cough, - shortness of breath, - wheezing. Neurologic: - numbness, - tingling. Psychiatric: - anxiety, - depression   Current Medication: Outpatient Encounter Medications as of 02/18/2023  Medication Sig   amoxicillin (AMOXIL) 500 MG capsule SMARTSIG:4 Capsule(s) By Mouth   acetaminophen (TYLENOL) 500 MG tablet Take by mouth.   amiodarone (PACERONE) 200 MG tablet Take by mouth.   apixaban (ELIQUIS) 5 MG TABS tablet Take 1 tablet by mouth every 12 (twelve) hours.   Artificial Tear Ointment (ULTRA FRESH PM) OINT Apply to eye.   aspirin 81 MG chewable tablet Chew by mouth.   atorvastatin (LIPITOR) 80 MG tablet Take 1 tablet by mouth daily.   B Complex Vitamins (VITAMIN B-COMPLEX) TABS Take by mouth.   Blood  Glucose Monitoring Suppl (GLUCOCOM BLOOD GLUCOSE MONITOR) DEVI 1 each by XX route as directed   calcium carbonate (TUMS EX) 750 MG chewable tablet Chew by mouth.   Cholecalciferol (VITAMIN D3) 25 MCG (1000 UT) CAPS Take by mouth.   Docusate Sodium (DSS) 100 MG CAPS Take by mouth.   ferrous sulfate 325 (65 FE) MG EC tablet Take by mouth.   furosemide (LASIX) 20 MG tablet Take by mouth.   ipratropium (ATROVENT) 0.03 % nasal spray Place into the nose.   magnesium oxide (MAG-OX) 400 MG tablet Take by mouth.   metoprolol succinate (TOPROL-XL) 50 MG 24 hr tablet Take 1 tablet by mouth daily.   nitroGLYCERIN (NITROSTAT) 0.4 MG SL tablet Place under the tongue.   OZEMPIC, 0.25 OR 0.5 MG/DOSE, 2 MG/3ML SOPN Inject into the skin.   pantoprazole (PROTONIX) 40 MG tablet Take by mouth.   polyethylene glycol powder (GLYCOLAX/MIRALAX) 17 GM/SCOOP powder Take 17 g by mouth daily as needed for moderate constipation.   tacrolimus (PROGRAF) 0.5 MG capsule Take by mouth.   traZODone (DESYREL) 100 MG tablet Take 100 mg by mouth at bedtime.   venlafaxine XR (EFFEXOR-XR) 150 MG 24 hr capsule Take by mouth.   [DISCONTINUED] Levothyroxine Sodium 100 MCG CAPS Take by mouth.   No facility-administered encounter medications on file as of 02/18/2023.    Surgical History: Past Surgical History:  Procedure Laterality Date   ABDOMINAL HYSTERECTOMY     ABLATION     BACK SURGERY     CORONARY ANGIOPLASTY WITH STENT PLACEMENT  HERNIA REPAIR     LIVER TRANSPLANT     REDUCTION MAMMAPLASTY  2003    Medical History: Past Medical History:  Diagnosis Date   Atrial fibrillation (HCC)    Cancer (HCC)    skin ca   Cancer (HCC)    liver ca   Diabetes mellitus without complication (HCC)    Hypertension    Myocardial infarct (HCC)    Thyroid disease     Family History: Non contributory to the present illness  Social History: Social History   Socioeconomic History   Marital status: Widowed    Spouse name:  Not on file   Number of children: Not on file   Years of education: Not on file   Highest education level: Not on file  Occupational History   Not on file  Tobacco Use   Smoking status: Never   Smokeless tobacco: Never  Substance and Sexual Activity   Alcohol use: Not Currently   Drug use: Never   Sexual activity: Not on file  Other Topics Concern   Not on file  Social History Narrative   Not on file   Social Determinants of Health   Financial Resource Strain: Low Risk  (11/15/2022)   Received from [facility]   Overall Financial Resource Strain (CARDIA)    Difficulty of Paying Living Expenses: Not hard at all  Food Insecurity: No Food Insecurity (11/15/2022)   Received from [facility]   Hunger Vital Sign    Worried About Running Out of Food in the Last Year: Never true    Ran Out of Food in the Last Year: Never true  Transportation Needs: No Transportation Needs (11/15/2022)   Received from [facility] - Transportation    In the past 12 months, has lack of transportation kept you from medical appointments or from getting medications?: No    Lack of Transportation (Non-Medical): No  Physical Activity: Inactive (09/03/2017)   Received from [facility], [facility]   Exercise Vital Sign    Days of Exercise per Week: 0 days    Minutes of Exercise per Session: 0 min  Stress: No Stress Concern Present (09/03/2017)   Received from [facility], [facility]   [employer] of Occupational Health - Occupational Stress Questionnaire    Feeling of Stress : Not at all  Social Connections: Unknown (09/03/2017)   Received from [facility], [facility]   Social Connection and Isolation Panel [NHANES]    Frequency of Communication with Friends and Family: Not on file    Frequency of Social Gatherings with Friends and  Family: Not on file    Attends Religious Services: Not on file    Active Member of Clubs or Organizations: Not on file    Attends Banker Meetings: Not on file    Marital Status: Married  Intimate Partner Violence: Not on file    Vital Signs: Blood pressure 126/72, pulse 60, resp. rate 14, height 5\' 4"  (1.626 m), weight 136 lb (61.7 kg), SpO2 99%. Body mass index is 23.34 kg/m.    Examination: General Appearance: The patient is well-developed, well-nourished, and in no distress. Neck Circumference: 38 cm Skin: Gross inspection of skin unremarkable. Head: normocephalic, no gross deformities. Eyes: no gross deformities noted. ENT: ears appear grossly normal Neurologic: Alert and oriented. No involuntary movements.  STOP BANG RISK ASSESSMENT S (snore) Have  you been told that you snore?     NO   T (tired) Are you often tired, fatigued, or sleepy during the day?   NO  O (obstruction) Do you stop breathing, choke, or gasp during sleep? NO   P (pressure) Do you have or are you being treated for high blood pressure? YES   B (BMI) Is your body index greater than 35 kg/m? NO   A (age) Are you 3 years old or older? YES   N (neck) Do you have a neck circumference greater than 16 inches?   NO   G (gender) Are you a female? NO   TOTAL STOP/BANG "YES" ANSWERS 2       A STOP-Bang score of 2 or less is considered low risk, and a score of 5 or more is high risk for having either moderate or severe OSA. For people who score 3 or 4, doctors may need to perform further assessment to determine how likely they are to have OSA.         EPWORTH SLEEPINESS SCALE:  Scale:  (0)= no chance of dozing; (1)= slight chance of dozing; (2)= moderate chance of dozing; (3)= high chance of dozing  Chance  Situtation    Sitting and reading: 0    Watching TV: 0    Sitting Inactive in public: 0    As a passenger in car: 1      Lying down to rest: 0    Sitting and talking: 0     Sitting quielty after lunch: 0    In a car, stopped in traffic: 0   TOTAL SCORE:   1 out of 24    SLEEP STUDIES:  PSG (10/2011) AHI 35/hr, REM AHI 71/hr, min SpO2 70% Titration (11/2011) BiPAP Titration  Titration (11/2011) BiPAP at 16/12 cmH2O to 19/15 cmH2O Titration (03/2021) BiPAP ASV @ EPAP min 5 max 15, PS min 3 max 15, maxpress 25 cmH2O HST (08/2022) AHI 6.4/Hr, min SpO2 81% Titration (10/2022) CPAP@ 8 cmH2O   CPAP COMPLIANCE DATA:  Date Range: 12/16/2022-02/13/2023  Average Daily Use: 8 hours 49 minutes  Median Use: 8 hours 51 minutes  Compliance for > 4 Hours: 98%  AHI: 3.8 respiratory events per hour  Days Used: 59/60 days  Mask Leak: 41  95th Percentile Pressure: 8         LABS: No results found for this or any previous visit (from the past 2160 hour(s)).  Radiology: MM 3D SCREENING MAMMOGRAM BILATERAL BREAST  Result Date: 12/11/2022 CLINICAL DATA:  Screening. EXAM: DIGITAL SCREENING BILATERAL MAMMOGRAM WITH TOMOSYNTHESIS AND CAD TECHNIQUE: Bilateral screening digital craniocaudal and mediolateral oblique mammograms were obtained. Bilateral screening digital breast tomosynthesis was performed. The images were evaluated with computer-aided detection. COMPARISON:  Previous exam(s). ACR Breast Density Category b: There are scattered areas of fibroglandular density. FINDINGS: There are no findings suspicious for malignancy. IMPRESSION: No mammographic evidence of malignancy. A result letter of this screening mammogram will be mailed directly to the patient. RECOMMENDATION: Screening mammogram in one year. (Code:SM-B-01Y) BI-RADS CATEGORY  1: Negative. Electronically Signed   By: Sherian Rein M.D.   On: 12/11/2022 15:10    No results found.  No results found.    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   Encounter for BiPAP use counseling  01/01/2022   Atrial fibrillation (HCC) 01/01/2022   OSA treated with  BiPAP 01/02/2021   Atrial fibrillation with RVR (HCC) 12/23/2020   Nonalcoholic steatohepatitis (NASH) 07/22/2020   Type 2 diabetes mellitus with stage 3 chronic kidney disease (HCC) 07/22/2020   Abnormal laboratory test result 04/07/2020   Chronic diastolic heart failure (HCC) 02/29/2020   Preoperative evaluation of a medical condition to rule out surgical contraindications (TAR required) 11/23/2019   Enthesopathy of left hip region 07/02/2019   Umbilical hernia without obstruction and without gangrene 06/24/2019   Liver transplant status (HCC) 06/09/2019   Trochanteric bursitis, left hip 06/09/2019   History of liver transplant (HCC) 06/09/2019   Left hip pain 03/31/2019   Glenohumeral arthritis 01/26/2019   S/P reverse total shoulder arthroplasty, right 01/26/2019   Anemia 12/31/2018   Bradycardia 12/31/2018   CKD (chronic kidney disease) stage 3, GFR 30-59 ml/min (HCC) 12/31/2018   Hyperlipidemia 12/31/2018   Insomnia 12/31/2018   Hyperlipidemia 12/31/2018   Chronic kidney disease, stage 4 (severe) (HCC) 12/31/2018   Rotator cuff tear arthropathy of right shoulder 12/22/2018   Sprain of rotator cuff capsule 12/22/2018   History of MI (myocardial infarction) 07/30/2017   Immunosuppressed status (HCC) 05/20/2017   PAF (paroxysmal atrial fibrillation) (HCC) 09/13/2016   Chronic anticoagulation 03/04/2016   Status post catheter ablation of atrial fibrillation 03/04/2016   Chronic bilateral low back pain with left-sided sciatica 01/04/2016   Chronic bilateral low back pain with left-sided sciatica 01/04/2016   Obstructive sleep apnea syndrome 10/19/2015   Lumbar stenosis 09/03/2012   Acquired hypothyroidism 12/28/2010   Coronary artery disease involving native coronary artery of native heart without angina pectoris 12/28/2010   Depression 12/28/2010   Essential hypertension 12/28/2010   Hypertriglyceridemia  without hypercholesterolemia 12/28/2010   Coronary artery disease involving native coronary artery of native heart without angina pectoris 12/28/2010    1. Complex sleep apnea syndrome The patient does tolerate PAP and reports  benefit from PAP use. She recently underwent a sleep study to determine if she still needed PAP and her apnea is much less severe. Titration showed that she was best controlled on cpap and her pressure was changed to 8 cm which is doing well for her. She feels more rested. The patient was reminded how to clean equipment and advised to replace supplies routinely. The compliance is excellent. The AHI is 3.8.   Complex sleep apnea syndrome- controlled. Continue with excellent compliance. Cpap continues to be medically necessary to treat this patient's OSA. F/u one year.   2. CPAP use counseling CPAP Counseling: had a lengthy discussion with the patient regarding the importance of PAP therapy in management of the sleep apnea. Patient appears to understand the risk factor reduction and also understands the risks associated with untreated sleep apnea. Patient will try to make a good faith effort to remain compliant with therapy. Also instructed the patient on proper cleaning of the device including the water must be changed daily if possible and use of distilled water is preferred. Patient understands that the machine should be regularly cleaned with appropriate recommended cleaning solutions that do not damage the PAP machine for example given white vinegar and water rinses. Other methods such as ozone treatment may not be as good as these simple methods to achieve cleaning.   3. Essential hypertension Hypertension Counseling:   The following hypertensive lifestyle modification were recommended and discussed:  1. Limiting alcohol intake to less than 1 oz/day of ethanol:(24 oz of beer or 8 oz of wine or 2 oz of 100-proof whiskey). 2. Take baby ASA  81 mg daily. 3. Importance of  regular aerobic exercise and losing weight. 4. Reduce dietary saturated fat and cholesterol intake for overall cardiovascular health. 5. Maintaining adequate dietary potassium, calcium, and magnesium intake. 6. Regular monitoring of the blood pressure. 7. Reduce sodium intake to less than 100 mmol/day (less than 2.3 gm of sodium or less than 6 gm of sodium choride)      General Counseling: I have discussed the findings of the evaluation and examination with Sara Larson.  I have also discussed any further diagnostic evaluation thatmay be needed or ordered today. Sara Larson verbalizes understanding of the findings of todays visit. We also reviewed her medications today and discussed drug interactions and side effects including but not limited excessive drowsiness and altered mental states. We also discussed that there is always a risk not just to her but also people around her. she has been encouraged to call the office with any questions or concerns that should arise related to todays visit.  No orders of the defined types were placed in this encounter.       I have personally obtained a history, examined the patient, evaluated laboratory and imaging results, formulated the assessment and plan and placed orders. This patient was seen today by Emmaline Kluver, PA-C in collaboration with Dr. Freda Munro.   Yevonne Pax, MD [facility] Diplomate ABMS Pulmonary Critical Care Medicine and Sleep Medicine

## 2023-02-18 ENCOUNTER — Ambulatory Visit (INDEPENDENT_AMBULATORY_CARE_PROVIDER_SITE_OTHER): Payer: Medicare Other | Admitting: Internal Medicine

## 2023-02-18 VITALS — BP 126/72 | HR 60 | Resp 14 | Ht 64.0 in | Wt 136.0 lb

## 2023-02-18 DIAGNOSIS — I10 Essential (primary) hypertension: Secondary | ICD-10-CM | POA: Diagnosis not present

## 2023-02-18 DIAGNOSIS — Z7189 Other specified counseling: Secondary | ICD-10-CM | POA: Insufficient documentation

## 2023-02-18 DIAGNOSIS — G4739 Other sleep apnea: Secondary | ICD-10-CM | POA: Diagnosis not present

## 2023-10-30 ENCOUNTER — Other Ambulatory Visit: Payer: Self-pay | Admitting: Family Medicine

## 2023-10-30 ENCOUNTER — Other Ambulatory Visit: Payer: Self-pay | Admitting: Physician Assistant

## 2023-10-30 DIAGNOSIS — Z1231 Encounter for screening mammogram for malignant neoplasm of breast: Secondary | ICD-10-CM

## 2023-12-16 ENCOUNTER — Ambulatory Visit
Admission: RE | Admit: 2023-12-16 | Discharge: 2023-12-16 | Disposition: A | Discharge status: Home / Self Care | Source: Ambulatory Visit | Attending: Physician Assistant | Admitting: Physician Assistant

## 2023-12-16 DIAGNOSIS — Z1231 Encounter for screening mammogram for malignant neoplasm of breast: Secondary | ICD-10-CM | POA: Insufficient documentation

## 2024-02-14 NOTE — Progress Notes (Unsigned)
 [facility] [address] [place], [place] [address]  Pulmonary Sleep Medicine   Office Visit Note  Patient Name: Sara Larson DOB: Oct 09, 1945 MRN [id number]    Chief Complaint: Obstructive Sleep Apnea visit  Brief History:  Sara Larson is seen today for an annual follow up visit for CPAP@ 8 cmH2O. The patient has a 11 year history of sleep apnea. Patient is using PAP nightly.  The patient feels rested after sleeping with PAP.  The patient reports benefiting from PAP use. Reported sleepiness is  improved and the Epworth Sleepiness Score is 2 out of 24. The patient will occasionally take naps. The patient complains of the following: none.  The compliance download shows 98% compliance with an average use time of 8 hours 36 minutes. The AHI is 3.5.  The patient does not complain of limb movements disrupting sleep. The patient continues to require PAP therapy in order to eliminate sleep apnea.   ROS  General: (-) fever, (-) chills, (-) night sweat Nose and Sinuses: (-) nasal stuffiness or itchiness, (-) postnasal drip, (-) nosebleeds, (-) sinus trouble. Mouth and Throat: (-) sore throat, (-) hoarseness. Neck: (-) swollen glands, (-) enlarged thyroid, (-) neck pain. Respiratory: - cough, - shortness of breath, - wheezing. Neurologic: - numbness, - tingling. Psychiatric: - anxiety, - depression   Current Medication: Outpatient Encounter Medications as of 02/17/2024  Medication Sig   acetaminophen (TYLENOL) 500 MG tablet Take by mouth.   amiodarone (PACERONE) 200 MG tablet Take by mouth.   amoxicillin (AMOXIL) 500 MG capsule SMARTSIG:4 Capsule(s) By Mouth   apixaban (ELIQUIS) 5 MG TABS tablet Take 1 tablet by mouth every 12 (twelve) hours.   Artificial Tear Ointment (ULTRA FRESH PM) OINT Apply to eye.   aspirin 81 MG chewable tablet Chew by mouth.   atorvastatin (LIPITOR) 80 MG tablet Take 1 tablet by mouth daily.   B Complex Vitamins (VITAMIN B-COMPLEX) TABS Take by mouth.    Blood Glucose Monitoring Suppl (GLUCOCOM BLOOD GLUCOSE MONITOR) DEVI 1 each by XX route as directed   calcium carbonate (TUMS EX) 750 MG chewable tablet Chew by mouth.   Cholecalciferol (VITAMIN D3) 25 MCG (1000 UT) CAPS Take by mouth.   Docusate Sodium  (DSS) 100 MG CAPS Take by mouth.   ferrous sulfate 325 (65 FE) MG EC tablet Take by mouth.   furosemide (LASIX) 20 MG tablet Take by mouth.   ipratropium (ATROVENT) 0.03 % nasal spray Place into the nose.   magnesium  oxide (MAG-OX) 400 MG tablet Take by mouth.   metoprolol succinate (TOPROL-XL) 50 MG 24 hr tablet Take 1 tablet by mouth daily.   nitroGLYCERIN (NITROSTAT) 0.4 MG SL tablet Place under the tongue.   OZEMPIC, 0.25 OR 0.5 MG/DOSE, 2 MG/3ML SOPN Inject into the skin.   pantoprazole (PROTONIX) 40 MG tablet Take by mouth.   polyethylene glycol powder (GLYCOLAX /MIRALAX ) 17 GM/SCOOP powder Take 17 g by mouth daily as needed for moderate constipation.   tacrolimus (PROGRAF) 0.5 MG capsule Take by mouth.   TIROSINT 75 MCG CAPS Take 1 capsule by mouth daily.   traZODone (DESYREL) 100 MG tablet Take 100 mg by mouth at bedtime.   venlafaxine XR (EFFEXOR-XR) 150 MG 24 hr capsule Take by mouth.   No facility-administered encounter medications on file as of 02/17/2024.    Surgical History: Past Surgical History:  Procedure Laterality Date   ABDOMINAL HYSTERECTOMY     ABLATION     BACK SURGERY     CORONARY ANGIOPLASTY  WITH STENT PLACEMENT     HERNIA REPAIR     LIVER TRANSPLANT     REDUCTION MAMMAPLASTY  2003    Medical History: Past Medical History:  Diagnosis Date   Atrial fibrillation (HCC)    Cancer (HCC)    skin ca   Cancer (HCC)    liver ca   Diabetes mellitus without complication (HCC)    Hypertension    Myocardial infarct (HCC)    Thyroid disease     Family History: Non contributory to the present illness  Social History: Social History   Socioeconomic History   Marital status: Widowed    Spouse name: Not  on file   Number of children: Not on file   Years of education: Not on file   Highest education level: Not on file  Occupational History   Not on file  Tobacco Use   Smoking status: Never   Smokeless tobacco: Never  Substance and Sexual Activity   Alcohol use: Not Currently   Drug use: Never   Sexual activity: Not on file  Other Topics Concern   Not on file  Social History Narrative   Not on file   Social Drivers of Health   Financial Resource Strain: Low Risk  (11/18/2023)   Received from [facility]   Overall Financial Resource Strain (CARDIA)    Difficulty of Paying Living Expenses: Not hard at all  Food Insecurity: No Food Insecurity (11/18/2023)   Received from [facility]   Hunger Vital Sign    Within the past 12 months, the food you bought just didn't last and you didn't have money to get more.: Never true    Within the past 12 months, you worried that your food would run out before you got the money to buy more.: Never true  Transportation Needs: No Transportation Needs (11/18/2023)   Received from [facility]   PRAPARE - Transportation    Lack of Transportation (Non-Medical): No    In the past 12 months, has lack of transportation kept you from medical appointments or from getting medications?: No  Physical Activity: Inactive (09/03/2017)   Received from [facility]   Exercise Vital Sign    Days of Exercise per Week: 0 days    Minutes of Exercise per Session: 0 min  Stress: No Stress Concern Present (09/03/2017)   Received from [facility] Occupational [facility] - Occupational Stress Questionnaire    Feeling of Stress : Not at all  Social Connections: Unknown (09/03/2017)   Received from [facility]   Social Connection and Isolation Panel    Frequency of Communication with Friends and Family: Not on file    Frequency of Social Gatherings  with Friends and Family: Not on file    Attends Religious Services: Not on file    Active Member of Clubs or Organizations: Not on file    Attends Banker Meetings: Not on file    Marital Status: Married  Intimate Partner Violence: Not on file    Vital Signs: Blood pressure (!) 173/88, pulse (!) 54, resp. rate 16, height 5' 4 (1.626 m), weight 145 lb (65.8 kg), SpO2 98%. Body mass index is 24.89 kg/m.    Examination: General Appearance: The patient is well-developed, well-nourished, and in no distress. Neck Circumference: 38 cm Skin: Gross inspection of skin unremarkable. Head: normocephalic, no gross deformities. Eyes: no gross deformities noted. ENT:  ears appear grossly normal Neurologic: Alert and oriented. No involuntary movements.  STOP BANG RISK ASSESSMENT S (snore) Have you been told that you snore?     NO   T (tired) Are you often tired, fatigued, or sleepy during the day?   NO  O (obstruction) Do you stop breathing, choke, or gasp during sleep? NO   P (pressure) Do you have or are you being treated for high blood pressure? YES   B (BMI) Is your body index greater than 35 kg/m? NO   A (age) Are you 21 years old or older? YES   N (neck) Do you have a neck circumference greater than 16 inches?   YES   G (gender) Are you a female? NO   TOTAL STOP/BANG "YES" ANSWERS 3       A STOP-Bang score of 2 or less is considered low risk, and a score of 5 or more is high risk for having either moderate or severe OSA. For people who score 3 or 4, doctors may need to perform further assessment to determine how likely they are to have OSA.         EPWORTH SLEEPINESS SCALE:  Scale:  (0)= no chance of dozing; (1)= slight chance of dozing; (2)= moderate chance of dozing; (3)= high chance of dozing  Chance  Situtation    Sitting and reading: 0    Watching TV: 0    Sitting Inactive in public: 0    As a passenger in car: 1      Lying down to rest: 1     Sitting and talking: 0    Sitting quielty after lunch: 0    In a car, stopped in traffic: 0   TOTAL SCORE:   2 out of 24    SLEEP STUDIES:  PSG (10/2011) AHI 35/hr, REM AHI 71/hr, min SpO2 70% Titration (11/2011) BiPAP Titration Titration (11/2011) BiPAP@ 16/12 cmH2O to 19/15 cmH2O Titration (03/2021) BiPAP ASV@ EPAP min 5 max 15, PS min 3 max 15, maxpress 25 cmH2O HST (08/2022) AHI 6.4/hr, min SpO2 81% Titration (10/2022) CPAP@ 8 cmH2O   CPAP COMPLIANCE DATA:  Date Range: 02/13/2023-02/12/2024  Average Daily Use: 8 hours 36 minutes  Median Use: 8 hours 52 minutes  Compliance for > 4 Hours: 98%  AHI: 3.5 respiratory events per hour  Days Used: 361/365 days  Mask Leak: 36.5  95th Percentile Pressure: 8         LABS: No results found for this or any previous visit (from the past 2160 hours).  Radiology: MM 3D SCREENING MAMMOGRAM BILATERAL BREAST Result Date: 12/20/2023 CLINICAL DATA:  Screening. EXAM: DIGITAL SCREENING BILATERAL MAMMOGRAM WITH TOMOSYNTHESIS AND CAD TECHNIQUE: Bilateral screening digital craniocaudal and mediolateral oblique mammograms were obtained. Bilateral screening digital breast tomosynthesis was performed. The images were evaluated with computer-aided detection. COMPARISON:  Previous exam(s). ACR Breast Density Category b: There are scattered areas of fibroglandular density. FINDINGS: There are no findings suspicious for malignancy. IMPRESSION: No mammographic evidence of malignancy. A result letter of this screening mammogram will be mailed directly to the patient. RECOMMENDATION: Screening mammogram in one year. (Code:SM-B-01Y) BI-RADS CATEGORY  1: Negative. Electronically Signed   By: Dina  Arceo M.D.   On: 12/20/2023 05:07    No results found.  No results found.    Assessment and Plan: Patient Active Problem List   Diagnosis Date Noted   OSA (obstructive sleep apnea) 02/17/2024   CPAP use counseling 02/18/2023   Complex sleep  apnea syndrome 01/01/2022   Encounter for BiPAP use counseling 01/01/2022   Atrial fibrillation (HCC) 01/01/2022   OSA treated with BiPAP 01/02/2021   Atrial fibrillation with RVR (HCC) 12/23/2020   Nonalcoholic steatohepatitis (NASH) 07/22/2020   Type 2 diabetes mellitus with stage 3 chronic kidney disease (HCC) 07/22/2020   Abnormal laboratory test result 04/07/2020   Chronic diastolic heart failure (HCC) 02/29/2020   Preoperative evaluation of a medical condition to rule out surgical contraindications (TAR required) 11/23/2019   Enthesopathy of left hip region 07/02/2019   Umbilical hernia without obstruction and without gangrene 06/24/2019   Liver transplant status (HCC) 06/09/2019   Trochanteric bursitis, left hip 06/09/2019   History of liver transplant (HCC) 06/09/2019   Left hip pain 03/31/2019   Glenohumeral arthritis 01/26/2019   S/P reverse total shoulder arthroplasty, right 01/26/2019   Anemia 12/31/2018   Bradycardia 12/31/2018   CKD (chronic kidney disease) stage 3, GFR 30-59 ml/min (HCC) 12/31/2018   Hyperlipidemia 12/31/2018   Insomnia 12/31/2018   Hyperlipidemia 12/31/2018   Chronic kidney disease, stage 4 (severe) (HCC) 12/31/2018   Rotator cuff tear arthropathy of right shoulder 12/22/2018   Sprain of rotator cuff capsule 12/22/2018   History of MI (myocardial infarction) 07/30/2017   Immunosuppressed status 05/20/2017   PAF (paroxysmal atrial fibrillation) (HCC) 09/13/2016   Chronic anticoagulation 03/04/2016   Status post catheter ablation of atrial fibrillation 03/04/2016   Chronic bilateral low back pain with left-sided sciatica 01/04/2016   Chronic bilateral low back pain with left-sided sciatica 01/04/2016   Obstructive sleep apnea syndrome 10/19/2015   Lumbar stenosis 09/03/2012   Acquired hypothyroidism 12/28/2010   Coronary artery disease involving native coronary artery of native heart without angina pectoris 12/28/2010   Depression 12/28/2010    Essential hypertension 12/28/2010   Hypertriglyceridemia without hypercholesterolemia 12/28/2010   Coronary artery disease involving native coronary artery of native heart without angina pectoris 12/28/2010   1. OSA (obstructive sleep apnea) (Primary) The patient does tolerate PAP and reports  benefit from PAP use. The patient was reminded how to clean equipment and advised to replace supplies routinely. . The compliance is excellent. The AHI is 3.5.   OSA on cpap- controlled. Continue with excellent compliance with pap. CPAP continues to be medically necessary to treat this patient's OSA. F/u one year.    2. CPAP use counseling CPAP Counseling: had a lengthy discussion with the patient regarding the importance of PAP therapy in management of the sleep apnea. Patient appears to understand the risk factor reduction and also understands the risks associated with untreated sleep apnea. Patient will try to make a good faith effort to remain compliant with therapy. Also instructed the patient on proper cleaning of the device including the water must be changed daily if possible and use of distilled water is preferred. Patient understands that the machine should be regularly cleaned with appropriate recommended cleaning solutions that do not damage the PAP machine for example given white vinegar and water rinses. Other methods such as ozone treatment may not be as good as these simple methods to achieve cleaning.   3. Essential hypertension Elevated today. Patient will reach out to her cardiologist for guidance.      General Counseling: I have discussed the findings of the evaluation and examination with [facility].  I have also discussed any further diagnostic evaluation thatmay be needed or ordered today. Sara Larson verbalizes understanding of the findings of todays visit. We also reviewed her medications today and discussed drug interactions and side  effects including but not limited excessive drowsiness and  altered mental states. We also discussed that there is always a risk not just to her but also people around her. she has been encouraged to call the office with any questions or concerns that should arise related to todays visit.  No orders of the defined types were placed in this encounter.       I have personally obtained a history, examined the patient, evaluated laboratory and imaging results, formulated the assessment and plan and placed orders. This patient was seen today by Lauraine Lay, PA-C in collaboration with Dr. Elfreda Bathe.   Elfreda DELENA Bathe, MD [facility] Diplomate ABMS Pulmonary Critical Care Medicine and Sleep Medicine

## 2024-02-17 ENCOUNTER — Ambulatory Visit (INDEPENDENT_AMBULATORY_CARE_PROVIDER_SITE_OTHER): Admitting: Internal Medicine

## 2024-02-17 VITALS — BP 173/88 | HR 54 | Resp 16 | Ht 64.0 in | Wt 145.0 lb

## 2024-02-17 DIAGNOSIS — Z7189 Other specified counseling: Secondary | ICD-10-CM | POA: Diagnosis not present

## 2024-02-17 DIAGNOSIS — G4733 Obstructive sleep apnea (adult) (pediatric): Secondary | ICD-10-CM | POA: Insufficient documentation

## 2024-02-17 DIAGNOSIS — I10 Essential (primary) hypertension: Secondary | ICD-10-CM | POA: Diagnosis not present

## 2024-02-17 NOTE — Patient Instructions (Signed)
